# Patient Record
Sex: Female | Born: 1975 | Race: White | Hispanic: No | Smoking: Current every day smoker
Health system: Southern US, Community
[De-identification: ages and names within clinical notes are randomized; demographics above are authoritative.]

## PROBLEM LIST (undated history)

## (undated) DIAGNOSIS — J45909 Unspecified asthma, uncomplicated: Secondary | ICD-10-CM

## (undated) DIAGNOSIS — E079 Disorder of thyroid, unspecified: Secondary | ICD-10-CM

---

## 2018-06-04 ENCOUNTER — Other Ambulatory Visit: Payer: Self-pay

## 2018-06-04 ENCOUNTER — Encounter (HOSPITAL_COMMUNITY): Payer: Self-pay | Admitting: *Deleted

## 2018-06-04 ENCOUNTER — Inpatient Hospital Stay (HOSPITAL_COMMUNITY)
Admission: AD | Admit: 2018-06-04 | Discharge: 2018-06-07 | DRG: 885 | Disposition: A | Discharge status: Home / Self Care | Payer: No Typology Code available for payment source | Source: Other Acute Inpatient Hospital | Attending: Psychiatry | Admitting: Psychiatry

## 2018-06-04 DIAGNOSIS — F319 Bipolar disorder, unspecified: Secondary | ICD-10-CM | POA: Diagnosis present

## 2018-06-04 DIAGNOSIS — J189 Pneumonia, unspecified organism: Secondary | ICD-10-CM | POA: Diagnosis present

## 2018-06-04 DIAGNOSIS — J45909 Unspecified asthma, uncomplicated: Secondary | ICD-10-CM | POA: Diagnosis present

## 2018-06-04 DIAGNOSIS — F259 Schizoaffective disorder, unspecified: Principal | ICD-10-CM | POA: Diagnosis present

## 2018-06-04 DIAGNOSIS — Z915 Personal history of self-harm: Secondary | ICD-10-CM

## 2018-06-04 DIAGNOSIS — R440 Auditory hallucinations: Secondary | ICD-10-CM | POA: Diagnosis not present

## 2018-06-04 DIAGNOSIS — F419 Anxiety disorder, unspecified: Secondary | ICD-10-CM | POA: Diagnosis present

## 2018-06-04 DIAGNOSIS — Z91030 Bee allergy status: Secondary | ICD-10-CM | POA: Diagnosis not present

## 2018-06-04 DIAGNOSIS — F4310 Post-traumatic stress disorder, unspecified: Secondary | ICD-10-CM | POA: Diagnosis present

## 2018-06-04 DIAGNOSIS — R001 Bradycardia, unspecified: Secondary | ICD-10-CM | POA: Diagnosis not present

## 2018-06-04 DIAGNOSIS — R062 Wheezing: Secondary | ICD-10-CM | POA: Diagnosis not present

## 2018-06-04 DIAGNOSIS — G4700 Insomnia, unspecified: Secondary | ICD-10-CM | POA: Diagnosis present

## 2018-06-04 DIAGNOSIS — F17210 Nicotine dependence, cigarettes, uncomplicated: Secondary | ICD-10-CM | POA: Diagnosis present

## 2018-06-04 DIAGNOSIS — E039 Hypothyroidism, unspecified: Secondary | ICD-10-CM | POA: Diagnosis present

## 2018-06-04 DIAGNOSIS — F251 Schizoaffective disorder, depressive type: Secondary | ICD-10-CM | POA: Diagnosis not present

## 2018-06-04 DIAGNOSIS — R45851 Suicidal ideations: Secondary | ICD-10-CM | POA: Diagnosis present

## 2018-06-04 DIAGNOSIS — Z79899 Other long term (current) drug therapy: Secondary | ICD-10-CM

## 2018-06-04 HISTORY — DX: Unspecified asthma, uncomplicated: J45.909

## 2018-06-04 MED ORDER — HYDROXYZINE HCL 25 MG PO TABS
25.0000 mg | ORAL_TABLET | Freq: Three times a day (TID) | ORAL | Status: DC | PRN
  Filled 2018-06-04: qty 10

## 2018-06-04 MED ORDER — TRAZODONE HCL 50 MG PO TABS
50.0000 mg | ORAL_TABLET | Freq: Every evening | ORAL | Status: DC | PRN
  Administered 2018-06-06 – 2018-06-07 (×2): 50 mg via ORAL
  Filled 2018-06-04: qty 1
  Filled 2018-06-04: qty 10

## 2018-06-04 MED ORDER — ALUM & MAG HYDROXIDE-SIMETH 200-200-20 MG/5ML PO SUSP: 30.0000 mL | ORAL | Status: DC | PRN

## 2018-06-04 MED ORDER — ACETAMINOPHEN 325 MG PO TABS: 650.0000 mg | ORAL_TABLET | Freq: Four times a day (QID) | ORAL | Status: DC | PRN

## 2018-06-04 MED ORDER — MAGNESIUM HYDROXIDE 400 MG/5ML PO SUSP: 30.0000 mL | Freq: Every day | ORAL | Status: DC | PRN

## 2018-06-04 MED ORDER — ENSURE ENLIVE PO LIQD
237.0000 mL | Freq: Two times a day (BID) | ORAL | Status: DC
  Administered 2018-06-05 – 2018-06-08 (×5): 237 mL via ORAL

## 2018-06-04 MED ORDER — NICOTINE 21 MG/24HR TD PT24
21.0000 mg | MEDICATED_PATCH | Freq: Every day | TRANSDERMAL | Status: DC
  Administered 2018-06-05 – 2018-06-07 (×3): 21 mg via TRANSDERMAL
  Filled 2018-06-04 (×5): qty 1

## 2018-06-04 NOTE — Tx Team (Signed)
Initial Treatment Plan 06/04/2018 [name] VOH:[id number]    PATIENT STRESSORS: Medication change or noncompliance Occupational concerns Substance abuse   PATIENT STRENGTHS: Average or above average intelligence Capable of independent living Communication skills General fund of knowledge Motivation for treatment/growth Physical Health Supportive family/friends   PATIENT IDENTIFIED PROBLEMS:   "I need to restart my meds."    "Get rid of the voices and visions."               DISCHARGE CRITERIA:  Improved stabilization in mood, thinking, and/or behavior Need for constant or close observation no longer present Reduction of life-threatening or endangering symptoms to within safe limits Verbal commitment to aftercare and medication compliance  PRELIMINARY DISCHARGE PLAN: Outpatient therapy Return to previous living arrangement  PATIENT/FAMILY INVOLVEMENT: This treatment plan has been presented to and reviewed with the patient, Gabrielle Carter, and/or family member.  The patient and family have been given the opportunity to ask questions and make suggestions.  Lawrence Marseilles, RN 06/04/2018, [phone]
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
## 2018-06-05 ENCOUNTER — Inpatient Hospital Stay (HOSPITAL_COMMUNITY): Payer: No Typology Code available for payment source

## 2018-06-05 ENCOUNTER — Inpatient Hospital Stay (HOSPITAL_COMMUNITY)
Admission: AD | Admit: 2018-06-05 | Discharge: 2018-06-05 | Disposition: A | Discharge status: Home / Self Care | Payer: No Typology Code available for payment source | Source: Other Acute Inpatient Hospital | Attending: Psychiatry | Admitting: Psychiatry

## 2018-06-05 LAB — HEMOGLOBIN A1C
Hgb A1c MFr Bld: 5.7 % — ABNORMAL HIGH (ref 4.8–5.6)
Mean Plasma Glucose: 116.89 mg/dL

## 2018-06-05 LAB — LIPID PANEL
Cholesterol: 177 mg/dL (ref 0–200)
HDL: 30 mg/dL — ABNORMAL LOW (ref >40)
LDL Cholesterol: 122 mg/dL — ABNORMAL HIGH (ref 0–99)
Total CHOL/HDL Ratio: 5.9 RATIO
Triglycerides: 123 mg/dL (ref <150)
VLDL: 25 mg/dL (ref 0–40)

## 2018-06-05 LAB — INFLUENZA PANEL BY PCR (TYPE A & B)
Influenza A By PCR: NEGATIVE
Influenza B By PCR: NEGATIVE

## 2018-06-05 LAB — T4, FREE: Free T4: 0.28 ng/dL — ABNORMAL LOW (ref 0.82–1.77)

## 2018-06-05 LAB — TSH: TSH: 97.877 u[IU]/mL — ABNORMAL HIGH (ref 0.350–4.500)

## 2018-06-05 MED ORDER — ALBUTEROL SULFATE HFA 108 (90 BASE) MCG/ACT IN AERS: 2.0000 | INHALATION_SPRAY | Freq: Four times a day (QID) | RESPIRATORY_TRACT | Status: DC | PRN

## 2018-06-05 MED ORDER — RISPERIDONE 0.5 MG PO TABS
0.5000 mg | ORAL_TABLET | Freq: Every day | ORAL | Status: DC
  Administered 2018-06-05: 0.5 mg via ORAL
  Filled 2018-06-05 (×2): qty 1

## 2018-06-05 MED ORDER — ALBUTEROL SULFATE (2.5 MG/3ML) 0.083% IN NEBU: 2.5000 mg | INHALATION_SOLUTION | Freq: Four times a day (QID) | RESPIRATORY_TRACT | Status: DC | PRN

## 2018-06-05 MED ORDER — SERTRALINE HCL 25 MG PO TABS
75.0000 mg | ORAL_TABLET | Freq: Every day | ORAL | Status: DC
  Administered 2018-06-05 – 2018-06-08 (×4): 75 mg via ORAL
  Filled 2018-06-05 (×6): qty 3

## 2018-06-05 MED ORDER — AZITHROMYCIN 250 MG PO TABS
250.0000 mg | ORAL_TABLET | Freq: Every day | ORAL | Status: DC
  Administered 2018-06-06 – 2018-06-08 (×3): 250 mg via ORAL
  Filled 2018-06-05 (×4): qty 1

## 2018-06-05 MED ORDER — AZITHROMYCIN 500 MG PO TABS
500.0000 mg | ORAL_TABLET | Freq: Every day | ORAL | Status: AC
  Administered 2018-06-05: 12:00:00 500 mg via ORAL
  Filled 2018-06-05: qty 1

## 2018-06-05 NOTE — Progress Notes (Signed)
Flu test performed and placed in lab refrigerator for pick up. Droplet precaution sign placed on patient's door. [facility] called for transport to [facility] radiology for chest x-ray.  Staff informed.

## 2018-06-05 NOTE — BHH Counselor (Addendum)
CSW attempted to complete PSA again, however CSW was informed by the patient's attending RN, that she currently does not feel well and does not want to participate in anything other than sleeping at this time.   Patient is being examined for possible flu. Currently on droplet precautions.   CSW will continue to follow and attempt PSA at a later time.    Baldo Daub, MSW, LCSWA Clinical Social Worker [facility]  Phone: [phone]

## 2018-06-05 NOTE — BHH Group Notes (Signed)
[facility] Group Notes:  Nursing Psychoeducation  Date:  06/05/2018  Time:  2:00 PM  Type of Therapy:  Psychoeducational Skills   Group Topic: Identifying Anxiety Triggers, Debunking Cognitive Distortions, and Utilizing Coping Skills  Participation Level:  Did Not Attend   Summary of Progress/Problems: Patient was invited but declined to attend group.   Clois Montavon A Zenith Lamphier 06/05/2018, 2:50 PM 

## 2018-06-05 NOTE — Progress Notes (Addendum)
Patient returned from [facility].  Patient laying in bed.  Food tray to be brought to patient.

## 2018-06-05 NOTE — Plan of Care (Signed)
D: Patient is cooperative. She is compliant with droplet precautions. Endorses passive SI. Denies HI. Endorses auditory hallucinations described as command voices to harm herself and others. Endorses visual hallucinations described as a black spot that comes down. She verbally contracts for safety. Patient has a cough. Patient denies other physical symptoms/pain. Patient states "I just want to sleep".  A: Medications administered per MD order. Support provided. Patient educated on safety on the unit and medications. Routine safety checks every 15 minutes. Patient stated understanding to tell nurse about any new physical symptoms. Patient understands to tell staff of any needs.     R: No adverse drug reactions noted. Patient verbally contracts for safety. Patient remains safe at this time and will continue to monitor.   Problem: Safety: Goal: Periods of time without injury will increase Outcome: Progressing   Patient remains safe and will continue to monitor.

## 2018-06-05 NOTE — BHH Counselor (Signed)
CSW attempted to complete PSA with the patient, however the patient refused to participate in the assessment. The patient reports she is "very depressed and does not feel like talking right now".   CSW will continue to follow and attempt to complete PSA at a later time.     Baldo Daub, MSW, LCSWA Clinical Social Worker [facility]  Phone: [phone]

## 2018-06-05 NOTE — Progress Notes (Signed)
Adult Psychoeducational Group Note  Date:  06/05/2018 Time:  8:37 PM  Group Topic/Focus:  Wrap-Up Group:   The focus of this group is to help patients review their daily goal of treatment and discuss progress on daily workbooks.  Participation Level:  Did Not Attend  Participation Quality:   Affect:   Cognitive:    Insight:   Engagement in Group:   Modes of Intervention:    Additional Comments:  Pt did not attend due to illness.  Kristine Linea 06/05/2018, 8:37 PM

## 2018-06-05 NOTE — B[facility] Suicide Risk Assessment (Signed)
[facility] Admission Suicide Risk Assessment   Nursing information obtained from:  Patient, Review of record Demographic factors:  Caucasian, Unemployed Current Mental Status:  Suicidal ideation indicated by patient, Suicide plan, Plan includes specific time, place, or method, Self-harm thoughts, Self-harm behaviors, Intention to act on suicide plan, Belief that plan would result in death, Thoughts of violence towards others Loss Factors:  Decrease in vocational status Historical Factors:  Family history of mental illness or substance abuse, Impulsivity Risk Reduction Factors:  Sense of responsibility to family, Living with another person, especially a relative, Positive social support  Total Time spent with patient: 30 minutes Principal Problem: <principal problem not specified> Diagnosis:  Active Problems:   Schizoaffective disorder (HCC)  Subjective Data: Patient is seen and examined.  Patient is a 43 year old female who stated she has a history of schizophrenia, bipolar disorder, depression, anxiety and suicidal ideation.  She presented to the [facility] emergency department on 06/04/2018 with suicidal ideation.  The patient stated that she had called 911 for help on the date of admission because she was having suicidal thoughts.  She had several suicide attempts in the past.  She stated over the last several days she had put a knife to her throat and caused a small laceration to her inferior chin.  She stated that time she was "on the verge of killing myself".  She admitted to helplessness, hopelessness and worthlessness.  She admitted to auditory and visual hallucinations.  She stated that she saw shadows go past her, and also admitted to hearing voices that were located both inside and outside of her head.  She is followed at [facility] in [place], but has not had her medications for the last 3 or 4 days.  She denied any previous psychiatric admissions.  Her drug screen was positive for marijuana as  well as cocaine.  She has a cough, and does not feel well from a pulmonary standpoint.  Review of the chart from [place] does not reveal a chest x-ray.  Her white count was normal.  She was admitted to the hospital for evaluation and stabilization.  Continued Clinical Symptoms:  Alcohol Use Disorder Identification Test Final Score (AUDIT): 1 The "Alcohol Use Disorders Identification Test", Guidelines for Use in Primary Care, Second Edition.  World Science writer [facility]). Score between 0-7:  no or low risk or alcohol related problems. Score between 8-15:  moderate risk of alcohol related problems. Score between 16-19:  high risk of alcohol related problems. Score 20 or above:  warrants further diagnostic evaluation for alcohol dependence and treatment.   CLINICAL FACTORS:   Bipolar Disorder:   Mixed State Depression:   Anhedonia Comorbid alcohol abuse/dependence Hopelessness Impulsivity Insomnia Alcohol/Substance Abuse/Dependencies   Musculoskeletal: Strength & Muscle Tone: within normal limits Gait & Station: normal Patient leans: N/A  Psychiatric Specialty Exam: Physical Exam  Nursing note and vitals reviewed. Constitutional: She is oriented to person, place, and time. She appears well-developed and well-nourished.  HENT:  Head: Normocephalic and atraumatic.  Respiratory: Effort normal.  Neurological: She is alert and oriented to person, place, and time.    ROS  Blood pressure 94/62, pulse 79, temperature 98.8 F (37.1 C), temperature source Oral, resp. rate 18, height 5\' 11"  (1.803 m), weight 94.8 kg.Body mass index is 29.15 kg/m.  General Appearance: Disheveled  Eye Contact:  Fair  Speech:  Normal Rate  Volume:  Normal  Mood:  Anxious, Depressed and Dysphoric  Affect:  Congruent  Thought Process:  Coherent and Descriptions  of Associations: Circumstantial  Orientation:  Full (Time, Place, and Person)  Thought Content:  Hallucinations: Auditory Visual  Suicidal  Thoughts:  Yes.  without intent/plan  Homicidal Thoughts:  No  Memory:  Immediate;   Fair Recent;   Fair Remote;   Fair  Judgement:  Impaired  Insight:  Lacking  Psychomotor Activity:  Increased  Concentration:  Concentration: Fair and Attention Span: Fair  Recall:  [employer]Fair  Fund of Knowledge:  Fair  Language:  Fair  Akathisia:  Negative  Handed:  Right  AIMS (if indicated):     Assets:  Desire for Improvement Housing Resilience  ADL's:  Intact  Cognition:  WNL  Sleep:  Number of Hours: 6.5      COGNITIVE FEATURES THAT CONTRIBUTE TO RISK:  None    SUICIDE RISK:   Minimal: No identifiable suicidal ideation.  Patients presenting with no risk factors but with morbid ruminations; may be classified as minimal risk based on the severity of the depressive symptoms  PLAN OF CARE: Patient is seen and examined.  Patient is a 43 year old female with the above-stated past psychiatric history who [facility] emergency department with suicidal ideation.  She was transferred to our facility for evaluation and stabilization.  She is followed at the local mental health center, but did not had her medications in the last several days.  She reportedly has been taking Risperdal as well as sertraline.  She will be restarted on these.  Her drug screen was positive for cocaine and marijuana, and that may have exacerbated things.  She stated that she does not know for sure that she can go back to stay with her husband.  She stated that her family does not like her, and will not have anything to do with her.  She refused to go into detail about that.  She will be admitted to the hospital.  She will be encouraged to attend groups.  She will be encouraged to be compliant with her medications.  Given her cough at this point, and the fact that review of her electronic medical record revealed a history of bilateral pneumonia.  I will order chest x-ray today.  We will start azithromycin for  community-acquired pneumonia.  We will also get an influenza a and B.  Her white count is normal.  She is afebrile at this point.  We will try to get collateral information for additional help.  I certify that inpatient services furnished can reasonably be expected to improve the patient's condition.   Antonieta PertGreg Lawson Dovey Fatzinger, MD 06/05/2018, 10:05 AM

## 2018-06-05 NOTE — Progress Notes (Signed)
D:  Patient stated she does have SI thoughts, no plan while at [facility].  Denied HI.  Does see shadows and hears voices to hurt herself.  Stated she has trouble sleeping and has decreased appetite before [facility] admission.  Patient has stayed in bed most of the day resting, "just let me sleep".  Patient did go to [facility] for chest xray this morning.   A:  Medications administered per MD orders.  Emotional support and encouragement given patient. R:  Safety maintained with 15 minute checks.  Food trays have been brought to her today.

## 2018-06-05 NOTE — Plan of Care (Signed)
Nurse discussed anxiety, depression, coping skills with patient. 

## 2018-06-05 NOTE — Progress Notes (Signed)
Pt did not attend orientation/goals group this morning.   

## 2018-06-05 NOTE — Progress Notes (Signed)
NUTRITION ASSESSMENT  Pt identified as at risk on the Malnutrition Screen Tool  INTERVENTION: 1. Supplements: Continue Ensure Enlive po BID, each supplement provides 350 kcal and 20 grams of protein  NUTRITION DIAGNOSIS: Unintentional weight loss related to sub-optimal intake as evidenced by pt report.   Goal: Pt to meet >/= 90% of their estimated nutrition needs.  Monitor:  PO intake  Assessment:  Pt admitted with depression and SI. UDS + for cocaine, THC. Pt reports poor appetite and losing 15 lb+ of weight loss recently. Per weight records available through care everywhere, pt weighed 232 lb at [facility] on 03/09/18, pt has since lost 24 lb (10% wt loss x 3 months, significant for time frame).  Height: Ht Readings from Last 1 Encounters:  06/04/18 5\' 11"  (1.803 m)    Weight: Wt Readings from Last 1 Encounters:  06/04/18 94.8 kg    Weight Hx: Wt Readings from Last 10 Encounters:  06/04/18 94.8 kg    BMI:  Body mass index is 29.15 kg/m. Pt meets criteria for overweight based on current BMI.  Estimated Nutritional Needs: Kcal: 25-30 kcal/kg Protein: > 1 gram protein/kg Fluid: 1 ml/kcal  Diet Order:  Diet Order            Diet regular Room service appropriate? Yes; Fluid consistency: Thin  Diet effective now             Pt is also offered choice of unit snacks mid-morning and mid-afternoon.  Pt is eating as desired.   Lab results and medications reviewed.   Tilda Franco, MS, RD, LDN [facility] Inpatient Clinical Dietitian Pager: [phone] After Hours Pager: [phone]

## 2018-06-05 NOTE — H&P (Signed)
Psychiatric Admission Assessment Adult  Patient Identification: Rakesha Caroleena Paolini MRN:  [id number] Date of Evaluation:  06/05/2018 Chief Complaint:  schizoaffective Principal Diagnosis: Schizoaffective disorder (HCC) Diagnosis:  Principal Problem:   Schizoaffective disorder (HCC)  History of Present Illness: From MD's admission SRA:  Patient is a 43 year old female who stated she has a history of schizophrenia, bipolar disorder, depression, anxiety and suicidal ideation.  She presented to the [facility] emergency department on 06/04/2018 with suicidal ideation.  The patient stated that she had called 911 for help on the date of admission because she was having suicidal thoughts.  She had several suicide attempts in the past.  She stated over the last several days she had put a knife to her throat and caused a small laceration to her inferior chin.  She stated that time she was "on the verge of killing myself".  She admitted to helplessness, hopelessness and worthlessness.  She admitted to auditory and visual hallucinations.  She stated that she saw shadows go past her, and also admitted to hearing voices that were located both inside and outside of her head.  She is followed at [facility] in [place], but has not had her medications for the last 3 or 4 days.  She denied any previous psychiatric admissions.  Her drug screen was positive for marijuana as well as cocaine.  She has a cough, and does not feel well from a pulmonary standpoint.  Review of the chart from  does not reveal a chest x-ray.  Her white count was normal.  She was admitted to the hospital for evaluation and stabilization.  Associated Signs/Symptoms: Depression Symptoms:  depressed mood, insomnia, fatigue, feelings of worthlessness/guilt, hopelessness, suicidal thoughts with specific plan, weight loss, decreased appetite, (Hypo) Manic Symptoms:  Irritable Mood, Anxiety Symptoms:  Excessive Worry, Psychotic Symptoms:   Hallucinations: Auditory Command:  to hurt herself Visual of shadows PTSD Symptoms: Patient reports prior diagnosis with PTSD but declines to elaborate. Total Time spent with patient: 30 minutes  Past Psychiatric History: Seen at [facility] for last five years. Previously diagnosed with depression, schizophrenia, PTSD, bipolar disorder. History of auditory hallucinations "for years," typically during episodes of depression. Denies history of hospitalizations but reports two prior suicide attempts via cutting throat, most recently several days prior to admission. Denies history of manic symptoms.   Is the patient at risk to self? Yes.    Has the patient been a risk to self in the past 6 months? Yes.    Has the patient been a risk to self within the distant past? Yes.    Is the patient a risk to others? No.  Has the patient been a risk to others in the past 6 months? No.  Has the patient been a risk to others within the distant past? No.   Prior Inpatient Therapy: Prior Inpatient Therapy: No Prior Outpatient Therapy: Prior Outpatient Therapy: Yes Prior Therapy Dates: Current Prior Therapy Facilty/Provider(s): [facility] in [place] Reason for Treatment: med management & therapy Does patient have an ACCT team?: No Does patient have Intensive In-House Services?  : No Does patient have [facility] services? : No Does patient have P4CC services?: No  Alcohol Screening: 1. How often do you have a drink containing alcohol?: Monthly or less 2. How many drinks containing alcohol do you have on a typical day when you are drinking?: 1 or 2 3. How often do you have six or more drinks on one occasion?: Never AUDIT-C Score: 1 4. How often during the  last year have you found that you were not able to stop drinking once you had started?: Never 5. How often during the last year have you failed to do what was normally expected from you becasue of drinking?: Never 6. How often during the last year have you needed  a first drink in the morning to get yourself going after a heavy drinking session?: Never 7. How often during the last year have you had a feeling of guilt of remorse after drinking?: Never 8. How often during the last year have you been unable to remember what happened the night before because you had been drinking?: Never 9. Have you or someone else been injured as a result of your drinking?: No 10. Has a relative or friend or a doctor or another health worker been concerned about your drinking or suggested you cut down?: No Alcohol Use Disorder Identification Test Final Score (AUDIT): 1 Alcohol Brief Interventions/Follow-up: AUDIT Score <7 follow-up not indicated Substance Abuse History in the last 12 months:  Yes.  Daily THC. UDS also +cocaine. Consequences of Substance Abuse: Patient denies Previous Psychotropic Medications: Yes  Psychological Evaluations: No  Past Medical History:  Past Medical History:  Diagnosis Date  . Asthma    History reviewed. No pertinent surgical history. Family History: History reviewed. No pertinent family history. Family Psychiatric  History: Denies Tobacco Screening: Have you used any form of tobacco in the last 30 days? (Cigarettes, Smokeless Tobacco, Cigars, and/or Pipes): Yes Tobacco use, Select all that apply: 5 or more cigarettes per day Are you interested in Tobacco Cessation Medications?: Yes, will notify MD for an order Counseled patient on smoking cessation including recognizing danger situations, developing coping skills and basic information about quitting provided: Refused/Declined practical counseling Social History:  Social History   Substance and Sexual Activity  Alcohol Use Yes  . Alcohol/week: 2.0 standard drinks  . Types: 2 Standard drinks or equivalent per week   Comment: monthly or less     Social History   Substance and Sexual Activity  Drug Use Yes  . Types: Cocaine, Marijuana    Additional Social History: Marital status:  Married    Pain Medications: None Prescriptions: Fluoxetine HCI  in AM  last taken 04/03; Olanzapine  daily last taken 04/03 Over the Counter: Unknown History of alcohol / drug use?: Yes Name of Substance 1: Marijuana 1 - Age of First Use: 43 years of age 25 - Amount (size/oz): One joint  1 - Frequency: Every other day  1 - Duration: on-going 1 - Last Use / Amount: Unknown                  Allergies:   Allergies  Allergen Reactions  . Bee Venom Anaphylaxis   Lab Results:  Results for orders placed or performed during the hospital encounter of 06/04/18 (from the past 48 hour(s))  Lipid panel     Status: Abnormal   Collection Time: 06/05/18  6:23 AM  Result Value Ref Range   Cholesterol 177 0 - 200 mg/dL   Triglycerides 161 <096 mg/dL   HDL 30 (L) >04 mg/dL   Total CHOL/HDL Ratio 5.9 RATIO   VLDL 25 0 - 40 mg/dL   LDL Cholesterol 540 (H) 0 - 99 mg/dL    Comment:        Total Cholesterol/HDL:CHD Risk Coronary Heart Disease Risk Table  Men   Women  1/2 Average Risk   3.4   3.3  Average Risk       5.0   4.4  2 X Average Risk   9.6   7.1  3 X Average Risk  23.4   11.0        Use the calculated Patient Ratio above and the CHD Risk Table to determine the patient's CHD Risk.        ATP III CLASSIFICATION (LDL):  <100     mg/dL   Optimal  [id number]  mg/dL   Near or Above                    Optimal  130-159  mg/dL   Borderline  [id number]  mg/dL   High  >811>190     mg/dL   Very High Performed at [facility], [address] [place], [place] [address]   Hemoglobin A1c     Status: Abnormal   Collection Time: 06/05/18  6:23 AM  Result Value Ref Range   Hgb A1c MFr Bld 5.7 (H) 4.8 - 5.6 %    Comment: (NOTE) Pre diabetes:          5.7%-6.4% Diabetes:              >6.4% Glycemic control for   <7.0% adults with diabetes    Mean Plasma Glucose 116.89 mg/dL    Comment: Performed at [facility], [address]  [place], [place] [address]  TSH     Status: Abnormal   Collection Time: 06/05/18  6:23 AM  Result Value Ref Range   TSH 97.877 (H) 0.350 - 4.500 uIU/mL    Comment: Performed by a 3rd Generation assay with a functional sensitivity of <=0.01 uIU/mL. Performed at [facility], [address] [place], [place] [address]     Blood Alcohol level:  No results found for: [facility]  Metabolic Disorder Labs:  Lab Results  Component Value Date   HGBA1C 5.7 (H) 06/05/2018   MPG 116.89 06/05/2018   No results found for: PROLACTIN Lab Results  Component Value Date   CHOL 177 06/05/2018   TRIG 123 06/05/2018   HDL 30 (L) 06/05/2018   CHOLHDL 5.9 06/05/2018   VLDL 25 06/05/2018   LDLCALC 122 (H) 06/05/2018    Current Medications: Current Facility-Administered Medications  Medication Dose Route Frequency Provider Last Rate Last Dose  . acetaminophen (TYLENOL) tablet 650 mg  650 mg Oral Q6H PRN Nira ConnBerry, Jason A, NP      . albuterol (PROVENTIL) (2.5 MG/3ML) 0.083% nebulizer solution 2.5 mg  2.5 mg Nebulization Q6H PRN Antonieta Pertlary, Greg Lawson, MD      . alum & mag hydroxide-simeth (MAALOX/MYLANTA) 200-200-20 MG/5ML suspension 30 mL  30 mL Oral Q4H PRN Nira ConnBerry, Jason A, NP      . azithromycin (ZITHROMAX) tablet 500 mg  500 mg Oral Daily Antonieta Pertlary, Greg Lawson, MD       Followed by  . [START ON 06/06/2018] azithromycin (ZITHROMAX) tablet 250 mg  250 mg Oral Daily Antonieta Pertlary, Greg Lawson, MD      . feeding supplement (ENSURE ENLIVE) (ENSURE ENLIVE) liquid 237 mL  237 mL Oral BID BM Cobos, Rockey SituFernando A, MD   237 mL at 06/05/18 1035  . hydrOXYzine (ATARAX/VISTARIL) tablet 25 mg  25 mg Oral TID PRN Nira ConnBerry, Jason A, NP      . magnesium hydroxide (MILK OF MAGNESIA) suspension 30 mL  30  mL Oral Daily PRN Nira Conn A, NP      . nicotine (NICODERM CQ - dosed in mg/24 hours) patch 21 mg  21 mg Transdermal Daily Cobos, Rockey Situ, MD   21 mg at 06/05/18 0900  . risperiDONE (RISPERDAL) tablet 0.5 mg  0.5 mg Oral QHS  Antonieta Pert, MD      . sertraline (ZOLOFT) tablet 75 mg  75 mg Oral Daily Antonieta Pert, MD      . traZODone (DESYREL) tablet 50 mg  50 mg Oral QHS PRN Jackelyn Poling, NP       PTA Medications: Medications Prior to Admission  Medication Sig Dispense Refill Last Dose  . FLUOXETINE HCL PO Take by mouth daily.      Marland Kitchen RISPERIDONE PO Take by mouth.       Musculoskeletal: Strength & Muscle Tone: within normal limits Gait & Station: normal Patient leans: N/A  Psychiatric Specialty Exam: Physical Exam  Nursing note and vitals reviewed. Constitutional: She is oriented to person, place, and time. She appears well-developed.  Cardiovascular: Normal rate.  Respiratory: Effort normal.  Neurological: She is alert and oriented to person, place, and time.    Review of Systems  Constitutional: Negative.   Psychiatric/Behavioral: Positive for depression, hallucinations, substance abuse (cocaine, THC) and suicidal ideas. The patient is not nervous/anxious and does not have insomnia.     Blood pressure 94/62, pulse 79, temperature 98.8 F (37.1 C), temperature source Oral, resp. rate 18, height 5\' 11"  (1.803 m), weight 94.8 kg.Body mass index is 29.15 kg/m.  See MD's admission SRA    Treatment Plan Summary: Daily contact with patient to assess and evaluate symptoms and progress in treatment and Medication management   Inpatient hospitalization.  See MD's admission SRA for medication management.  Patient will participate in the therapeutic group milieu.  Discharge disposition in progress.   Observation Level/Precautions:  15 minute checks, droplet precautions for cough  Laboratory:  influenza  Psychotherapy:  Mental health and substance use  Medications:  See MAR  Consultations:  PRN  Discharge Concerns:  Safety and stabilization  Estimated LOS: 3-5 days  Other:     Physician Treatment Plan for Primary Diagnosis: Schizoaffective disorder (HCC) Long Term Goal(s):  Improvement in symptoms so as ready for discharge  Short Term Goals: Ability to identify changes in lifestyle to reduce recurrence of condition will improve, Ability to verbalize feelings will improve and Ability to disclose and discuss suicidal ideas  Physician Treatment Plan for Secondary Diagnosis: Principal Problem:   Schizoaffective disorder (HCC)  Long Term Goal(s): Improvement in symptoms so as ready for discharge  Short Term Goals: Ability to demonstrate self-control will improve and Ability to identify and develop effective coping behaviors will improve  I certify that inpatient services furnished can reasonably be expected to improve the patient's condition.    Aldean Baker, NP 4/7/202011:05 AM

## 2018-06-05 NOTE — Progress Notes (Signed)
Gabrielle Carter from [employer], phone [phone], called and stated:   Patient is to wear a mask, wash hands often.  Food tray to be brought to her room.  Wipe surfaces often.  Droplet precaution sign has been placed on her door.    If any questions, MD/staff can call her and she will relay message to Dr. Ninetta Lights (pager [phone]).

## 2018-06-06 ENCOUNTER — Inpatient Hospital Stay (HOSPITAL_COMMUNITY): Payer: No Typology Code available for payment source

## 2018-06-06 DIAGNOSIS — R45851 Suicidal ideations: Secondary | ICD-10-CM

## 2018-06-06 DIAGNOSIS — F251 Schizoaffective disorder, depressive type: Secondary | ICD-10-CM

## 2018-06-06 DIAGNOSIS — R440 Auditory hallucinations: Secondary | ICD-10-CM

## 2018-06-06 DIAGNOSIS — E039 Hypothyroidism, unspecified: Secondary | ICD-10-CM

## 2018-06-06 DIAGNOSIS — R062 Wheezing: Secondary | ICD-10-CM

## 2018-06-06 LAB — T3, FREE: T3, Free: 1.4 pg/mL — ABNORMAL LOW (ref 2.0–4.4)

## 2018-06-06 MED ORDER — RISPERIDONE 1 MG PO TABS
1.0000 mg | ORAL_TABLET | Freq: Every day | ORAL | Status: DC
  Administered 2018-06-06 – 2018-06-07 (×2): 1 mg via ORAL
  Filled 2018-06-06 (×3): qty 1

## 2018-06-06 MED ORDER — LEVOTHYROXINE SODIUM 50 MCG PO TABS
50.0000 ug | ORAL_TABLET | Freq: Every day | ORAL | Status: DC
  Administered 2018-06-06 – 2018-06-08 (×3): 50 ug via ORAL
  Filled 2018-06-06 (×3): qty 1
  Filled 2018-06-06: qty 2
  Filled 2018-06-06: qty 1

## 2018-06-06 NOTE — Progress Notes (Signed)
[facility] MD Progress Note  06/06/2018 9:50 AM Gabrielle Carter  MRN:  [id number] Subjective:  "I'm not good. I'm still depressed."  Gabrielle Carter resting in bed. Presents with depressed affect, minimal speech. She reports continued severe depression, unchanged since admission and is unable to identify triggers. T3 1.4 and T4 0.28. Patient denies known history of thyroid problems. She is going for thyroid ultrasound today. She continues to report intermittent AH, including CAH to hurt herself. She reports ongoing suicidal ideation with no plan or intent but contracts for safety on the unit. Denies HI. She reports cough has improved since starting Zithromax. Denies myalgias, SOB, headache. She is afebrile. Influenza A and B negative. Reports fair sleep and appetite.  From admission H&P: Patient is a 43 year old female who stated she has a history of schizophrenia, bipolar disorder, depression, anxiety and suicidal ideation. She presented to the [facility] emergency department on 06/04/2018 with suicidal ideation. She admitted to auditory and visual hallucinations.She stated that she saw shadows go past her, and also admitted to hearing voices that were located both inside and outside of her head.   Principal Problem: Schizoaffective disorder (HCC) Diagnosis: Principal Problem:   Schizoaffective disorder (HCC) Active Problems:   Wheezing  Total Time spent with patient: 15 minutes  Past Psychiatric History: See admission H&P  Past Medical History:  Past Medical History:  Diagnosis Date  . Asthma    History reviewed. No pertinent surgical history. Family History: History reviewed. No pertinent family history. Family Psychiatric  History: See admission H&P Social History:  Social History   Substance and Sexual Activity  Alcohol Use Yes  . Alcohol/week: 2.0 standard drinks  . Types: 2 Standard drinks or equivalent per week   Comment: monthly or less     Social History   Substance and  Sexual Activity  Drug Use Yes  . Types: Cocaine, Marijuana    Social History   Socioeconomic History  . Marital status: Married    Spouse name: Gabrielle Carter  . Number of children: Not on file  . Years of education: Not on file  . Highest education level: Not on file  Occupational History  . Not on file  Social Needs  . Financial resource strain: Not on file  . Food insecurity:    Worry: Not on file    Inability: Not on file  . Transportation needs:    Medical: Not on file    Non-medical: Not on file  Tobacco Use  . Smoking status: Current Every Day Smoker    Packs/day: 1.50    Types: Cigarettes  . Smokeless tobacco: Never Used  Substance and Sexual Activity  . Alcohol use: Yes    Alcohol/week: 2.0 standard drinks    Types: 2 Standard drinks or equivalent per week    Comment: monthly or less  . Drug use: Yes    Types: Cocaine, Marijuana  . Sexual activity: Yes  Lifestyle  . Physical activity:    Days per week: Not on file    Minutes per session: Not on file  . Stress: Not on file  Relationships  . Social connections:    Talks on phone: Not on file    Gets together: Not on file    Attends religious service: Not on file    Active member of club or organization: Not on file    Attends meetings of clubs or organizations: Not on file    Relationship status: Not on file  Other Topics Concern  .  Not on file  Social History Narrative  . Not on file   Additional Social History:    Pain Medications: None Prescriptions: Fluoxetine HCI 40mg  in AM  last taken 04/03; Olanzapine 10mg  daily last taken 04/03 Over the Counter: Unknown History of alcohol / drug use?: Yes Name of Substance 1: Marijuana 1 - Age of First Use: 43 years of age 55 - Amount (size/oz): One joint  1 - Frequency: Every other day  1 - Duration: on-going 1 - Last Use / Amount: Unknown                  Sleep: Fair  Appetite:  Fair  Current Medications: Current Facility-Administered  Medications  Medication Dose Route Frequency Provider Last Rate Last Dose  . acetaminophen (TYLENOL) tablet 650 mg  650 mg Oral Q6H PRN Nira ConnBerry, Jason A, NP      . albuterol (PROVENTIL HFA;VENTOLIN HFA) 108 (90 Base) MCG/ACT inhaler 2 puff  2 puff Inhalation Q6H PRN Cobos, Rockey SituFernando A, MD      . alum & mag hydroxide-simeth (MAALOX/MYLANTA) 200-200-20 MG/5ML suspension 30 mL  30 mL Oral Q4H PRN Nira ConnBerry, Jason A, NP      . azithromycin (ZITHROMAX) tablet 250 mg  250 mg Oral Daily Antonieta Pertlary, Greg Lawson, MD   250 mg at 06/06/18 0744  . feeding supplement (ENSURE ENLIVE) (ENSURE ENLIVE) liquid 237 mL  237 mL Oral BID BM Cobos, Rockey SituFernando A, MD   237 mL at 06/05/18 1545  . hydrOXYzine (ATARAX/VISTARIL) tablet 25 mg  25 mg Oral TID PRN Nira ConnBerry, Jason A, NP      . magnesium hydroxide (MILK OF MAGNESIA) suspension 30 mL  30 mL Oral Daily PRN Nira ConnBerry, Jason A, NP      . nicotine (NICODERM CQ - dosed in mg/24 hours) patch 21 mg  21 mg Transdermal Daily Cobos, Rockey SituFernando A, MD   21 mg at 06/06/18 0744  . risperiDONE (RISPERDAL) tablet 0.5 mg  0.5 mg Oral QHS Antonieta Pertlary, Greg Lawson, MD   0.5 mg at 06/05/18 2116  . sertraline (ZOLOFT) tablet 75 mg  75 mg Oral Daily Antonieta Pertlary, Greg Lawson, MD   75 mg at 06/06/18 0744  . traZODone (DESYREL) tablet 50 mg  50 mg Oral QHS PRN Jackelyn PolingBerry, Jason A, NP        Lab Results:  Results for orders placed or performed during the hospital encounter of 06/04/18 (from the past 48 hour(s))  Lipid panel     Status: Abnormal   Collection Time: 06/05/18  6:23 AM  Result Value Ref Range   Cholesterol 177 0 - 200 mg/dL   Triglycerides 161123 <096<150 mg/dL   HDL 30 (L) >04>40 mg/dL   Total CHOL/HDL Ratio 5.9 RATIO   VLDL 25 0 - 40 mg/dL   LDL Cholesterol 540122 (H) 0 - 99 mg/dL    Comment:        Total Cholesterol/HDL:CHD Risk Coronary Heart Disease Risk Table                     Men   Women  1/2 Average Risk   3.4   3.3  Average Risk       5.0   4.4  2 X Average Risk   9.6   7.1  3 X Average Risk  23.4   11.0         Use the calculated Patient Ratio above and the CHD Risk Table to determine the patient's CHD Risk.  ATP III CLASSIFICATION (LDL):  <100     mg/dL   Optimal  161-096  mg/dL   Near or Above                    Optimal  130-159  mg/dL   Borderline  045-409  mg/dL   High  >811     mg/dL   Very High Performed at [facility], [address], [place], [place] [address]   Hemoglobin A1c     Status: Abnormal   Collection Time: 06/05/18  6:23 AM  Result Value Ref Range   Hgb A1c MFr Bld 5.7 (H) 4.8 - 5.6 %    Comment: (NOTE) Pre diabetes:          5.7%-6.4% Diabetes:              >6.4% Glycemic control for   <7.0% adults with diabetes    Mean Plasma Glucose 116.89 mg/dL    Comment: Performed at [facility], [address], [place], [place] [address]  TSH     Status: Abnormal   Collection Time: 06/05/18  6:23 AM  Result Value Ref Range   TSH 97.877 (H) 0.350 - 4.500 uIU/mL    Comment: Performed by a 3rd Generation assay with a functional sensitivity of <=0.01 uIU/mL. Performed at [facility], [address], [place], [place] [address]   Influenza panel by PCR (type A & B)     Status: None   Collection Time: 06/05/18 10:37 AM  Result Value Ref Range   Influenza A By PCR NEGATIVE NEGATIVE   Influenza B By PCR NEGATIVE NEGATIVE    Comment: (NOTE) The Xpert Xpress Flu assay is intended as an aid in the diagnosis of  influenza and should not be used as a sole basis for treatment.  This  assay is FDA approved for nasopharyngeal swab specimens only. Nasal  washings and aspirates are unacceptable for Xpert Xpress Flu testing. Performed at [facility], [address] [place], [place] [address]   T3, free     Status: Abnormal   Collection Time: 06/05/18  6:50 PM  Result Value Ref Range   T3, Free 1.4 (L) 2.0 - 4.4 pg/mL    Comment: (NOTE) Performed At: [facility] [address] [place], [place]  [id number] Jolene Schimke MD LK:[id number]   T4, free     Status: Abnormal   Collection Time: 06/05/18  6:50 PM  Result Value Ref Range   Free T4 0.28 (L) 0.82 - 1.77 ng/dL    Comment: (NOTE) Biotin ingestion may interfere with free T4 tests. If the results are inconsistent with the TSH level, previous test results, or the clinical presentation, then consider biotin interference. If needed, order repeat testing after stopping biotin. Performed at [facility], [address], [place], [place] [address]     Blood Alcohol level:  No results found for: [facility]  Metabolic Disorder Labs: Lab Results  Component Value Date   HGBA1C 5.7 (H) 06/05/2018   MPG 116.89 06/05/2018   No results found for: PROLACTIN Lab Results  Component Value Date   CHOL 177 06/05/2018   TRIG 123 06/05/2018   HDL 30 (L) 06/05/2018   CHOLHDL 5.9 06/05/2018   VLDL 25 06/05/2018   LDLCALC 122 (H) 06/05/2018    Physical Findings: AIMS: Facial and Oral Movements Muscles of Facial Expression: None, normal Lips and Perioral Area: None, normal Jaw: None, normal  Tongue: None, normal,Extremity Movements Upper (arms, wrists, hands, fingers): None, normal Lower (legs, knees, ankles, toes): None, normal, Trunk Movements Neck, shoulders, hips: None, normal, Overall Severity Severity of abnormal movements (highest score from questions above): None, normal Incapacitation due to abnormal movements: None, normal Patient's awareness of abnormal movements (rate only patient's report): No Awareness, Dental Status Current problems with teeth and/or dentures?: No Does patient usually wear dentures?: No  CIWA:  CIWA-Ar Total: 1 COWS:  COWS Total Score: 1  Musculoskeletal: Strength & Muscle Tone: within normal limits Gait & Station: normal Patient leans: N/A  Psychiatric Specialty Exam: Physical Exam  Nursing note and vitals reviewed. Constitutional: She is oriented to person, place, and time. She appears  well-developed and well-nourished.  Cardiovascular: Normal rate.  Respiratory: Effort normal.  Neurological: She is alert and oriented to person, place, and time.    Review of Systems  Constitutional: Negative.   Respiratory: Positive for cough. Negative for shortness of breath.   Cardiovascular: Negative for chest pain.  Gastrointestinal: Negative for nausea and vomiting.  Musculoskeletal: Negative for myalgias.  Psychiatric/Behavioral: Positive for depression, hallucinations, substance abuse (cocaine, THC) and suicidal ideas. Negative for memory loss. The patient is not nervous/anxious and does not have insomnia.     Blood pressure 104/60, pulse (!) 50, temperature 98.4 F (36.9 C), temperature source Oral, resp. rate 18, height 5\' 11"  (1.803 m), weight 94.8 kg, last menstrual period 06/03/2018.Body mass index is 29.15 kg/m.  General Appearance: Disheveled  Eye Contact:  Minimal  Speech:  Slow  Volume:  Decreased  Mood:  Depressed and Irritable  Affect:  Congruent  Thought Process:  Coherent  Orientation:  Full (Time, Place, and Person)  Thought Content:  Hallucinations: Auditory Command:  to hurt herself Visual  Suicidal Thoughts:  Yes.  without intent/plan  Homicidal Thoughts:  No  Memory:  Immediate;   Fair Recent;   Fair  Judgement:  Intact  Insight:  Fair  Psychomotor Activity:  Decreased  Concentration:  Concentration: Fair  Recall:  Poor  Fund of Knowledge:  Fair  Language:  Fair  Akathisia:  No  Handed:  Right  AIMS (if indicated):     Assets:  Communication Skills Housing Social Support  ADL's:  Intact  Cognition:  WNL  Sleep:  Number of Hours: 5.75     Treatment Plan Summary: Daily contact with patient to assess and evaluate symptoms and progress in treatment and Medication management   Continue inpatient hospitalization.  Thyroid ultrasound pending Start Synthroid 50 mcg PO daily for hypothyroidism Increase Risperdal to 1 mg PO QHS for  AVH Continue Zoloft 75 mg PO daily for mood Continue trazodone 50 mg PO QHS PRN insomnia Continue Vistaril 25 mg PO TID PRN anxiety Continue Zithromax 250 mg PO daily for pneumonia Continue albuterol inhaler PRN SOB  Patient will participate in the therapeutic group milieu.  Discharge disposition in progress.   Aldean Baker, NP 06/06/2018, 9:50 AM

## 2018-06-06 NOTE — Progress Notes (Signed)
D    Pt in her room in bed but not asleep    She reports feeling better and took her medications    She has been isolated to her room and is on droplet precautions for having a cough but has been afebrile since admission with no other symptoms    She has been cooperative A   Verbal support given   Medications administered and effectiveness monitored    Q 15 min checks  R    Pt remains safe at this tiem

## 2018-06-06 NOTE — B[facility] Counselor (Signed)
CSW attempted to engage with the patient and complete PSA, however the patient refused to participate. She reports that she does not feel well and that she did not feel like talking to this clinician.   CSW will continue to follow.    Baldo Daub, MSW, LCSWA Clinical Social Worker [facility]  Phone: [phone]

## 2018-06-06 NOTE — B[facility] Group Notes (Signed)
Adult Psychoeducational Group Note  Date:  06/06/2018 Time:  4:10 PM  Group Topic/Focus:  Crisis Planning:   The purpose of this group is to help patients create a crisis plan for use upon discharge or in the future, as needed.  Participation Level:  Did Not Attend  Participation Quality:    Affect:    Cognitive:    Insight:   Engagement in Group:    Modes of Intervention:    Additional Comments: Pt could not attend due to droplet precautions  Donell Beers 06/06/2018, 4:10 PM

## 2018-06-06 NOTE — Progress Notes (Signed)
Recreation Therapy Notes  Date:  4.8.20 Time: 0930 Location: [address] Dayroom  Group Topic: Stress Management  Goal Area(s) Addresses:  Patient will identify positive stress management techniques. Patient will identify benefits of using stress management post d/c.  Intervention: Stress Management  Activity :  Meditation.  LRT introduced the stress management technique of meditation.  LRT played a meditation that focused on making the most of your day and the possibilities it offers.  Patients were to follow along as meditation played to engage in activity.  Education:  Stress Management, Discharge Planning.   Education Outcome: Acknowledges Education  Clinical Observations/Feedback:  Pt did not attend group.     Kandiss Ihrig, LRT/CTRS         Yuette Putnam A 06/06/2018 10:38 AM 

## 2018-06-06 NOTE — B[facility] Group Notes (Signed)
The focus of this group is to help patients establish daily goals to achieve during treatment and discuss how the patient can incorporate goal setting into their daily lives to aide in recovery.  Pt could not attend goals group due to being on droplet precautions.

## 2018-06-06 NOTE — [facility] Suicide Risk Assessment (Signed)
[facility] INPATIENT:  Family/Significant Other Suicide Prevention Education  Suicide Prevention Education:  Patient Refusal for Family/Significant Other Suicide Prevention Education: The patient Gabrielle Carter has refused to provide written consent for family/significant other to be provided Family/Significant Other Suicide Prevention Education during admission and/or prior to discharge.  Physician notified.  SPE completed with patient, as patient refused to consent to family contact. SPI pamphlet provided to pt and pt was encouraged to share information with support network, ask questions, and talk about any concerns relating to SPE. Patient denies access to guns/firearms and verbalized understanding of information provided. Mobile Crisis information also provided to patient.     [name] 06/06/2018, 10:56 AM

## 2018-06-06 NOTE — Tx Team (Signed)
Interdisciplinary Treatment and Diagnostic Plan Update  06/06/2018 Time of Session:  Gabrielle Carter MRN: [id number]  Principal Diagnosis: Schizoaffective disorder [facility])  Secondary Diagnoses: Principal Problem:   Schizoaffective disorder ([place]) Active Problems:   Wheezing   Current Medications:  Current Facility-Administered Medications  Medication Dose Route Frequency Provider Last Rate Last Dose  . acetaminophen (TYLENOL) tablet 650 mg  650 mg Oral Q6H PRN Lindon Romp A, NP      . albuterol (PROVENTIL HFA;VENTOLIN HFA) 108 (90 Base) MCG/ACT inhaler 2 puff  2 puff Inhalation Q6H PRN Cobos, Myer Peer, MD      . alum & mag hydroxide-simeth (MAALOX/MYLANTA) 200-200-20 MG/5ML suspension 30 mL  30 mL Oral Q4H PRN Lindon Romp A, NP      . azithromycin (ZITHROMAX) tablet 250 mg  250 mg Oral Daily Sharma Covert, MD   250 mg at 06/06/18 0744  . feeding supplement (ENSURE ENLIVE) (ENSURE ENLIVE) liquid 237 mL  237 mL Oral BID BM Cobos, Myer Peer, MD   237 mL at 06/05/18 1545  . hydrOXYzine (ATARAX/VISTARIL) tablet 25 mg  25 mg Oral TID PRN Rozetta Nunnery, NP      . levothyroxine (SYNTHROID, LEVOTHROID) tablet 50 mcg  50 mcg Oral Q0600 Sharma Covert, MD      . magnesium hydroxide (MILK OF MAGNESIA) suspension 30 mL  30 mL Oral Daily PRN Lindon Romp A, NP      . nicotine (NICODERM CQ - dosed in mg/24 hours) patch 21 mg  21 mg Transdermal Daily Cobos, Myer Peer, MD   21 mg at 06/06/18 0744  . risperiDONE (RISPERDAL) tablet 1 mg  1 mg Oral QHS Sharma Covert, MD      . sertraline (ZOLOFT) tablet 75 mg  75 mg Oral Daily Sharma Covert, MD   75 mg at 06/06/18 0744  . traZODone (DESYREL) tablet 50 mg  50 mg Oral QHS PRN Rozetta Nunnery, NP       PTA Medications: Medications Prior to Admission  Medication Sig Dispense Refill Last Dose  . FLUOXETINE HCL PO Take by mouth daily.      Marland Kitchen RISPERIDONE PO Take by mouth.       Patient Stressors: Medication change or  noncompliance Occupational concerns Substance abuse  Patient Strengths: Average or above average intelligence Capable of independent living Communication skills General fund of knowledge Motivation for treatment/growth Physical Health Supportive family/friends  Treatment Modalities: Medication Management, Group therapy, Case management,  1 to 1 session with clinician, Psychoeducation, Recreational therapy.   Physician Treatment Plan for Primary Diagnosis: Schizoaffective disorder ([place]) Long Term Goal(s): Improvement in symptoms so as ready for discharge Improvement in symptoms so as ready for discharge   Short Term Goals: Ability to identify changes in lifestyle to reduce recurrence of condition will improve Ability to verbalize feelings will improve Ability to disclose and discuss suicidal ideas Ability to demonstrate self-control will improve Ability to identify and develop effective coping behaviors will improve  Medication Management: Evaluate patient's response, side effects, and tolerance of medication regimen.  Therapeutic Interventions: 1 to 1 sessions, Unit Group sessions and Medication administration.  Evaluation of Outcomes: Not Met  Physician Treatment Plan for Secondary Diagnosis: Principal Problem:   Schizoaffective disorder ([place]) Active Problems:   Wheezing  Long Term Goal(s): Improvement in symptoms so as ready for discharge Improvement in symptoms so as ready for discharge   Short Term Goals: Ability to identify changes in lifestyle to reduce recurrence of condition  will improve Ability to verbalize feelings will improve Ability to disclose and discuss suicidal ideas Ability to demonstrate self-control will improve Ability to identify and develop effective coping behaviors will improve     Medication Management: Evaluate patient's response, side effects, and tolerance of medication regimen.  Therapeutic Interventions: 1 to 1 sessions, Unit Group  sessions and Medication administration.  Evaluation of Outcomes: Not Met   RN Treatment Plan for Primary Diagnosis: Schizoaffective disorder ([place]) Long Term Goal(s): Knowledge of disease and therapeutic regimen to maintain health will improve  Short Term Goals: Ability to participate in decision making will improve, Ability to verbalize feelings will improve, Ability to disclose and discuss suicidal ideas and Ability to identify and develop effective coping behaviors will improve  Medication Management: RN will administer medications as ordered by provider, will assess and evaluate patient's response and provide education to patient for prescribed medication. RN will report any adverse and/or side effects to prescribing provider.  Therapeutic Interventions: 1 on 1 counseling sessions, Psychoeducation, Medication administration, Evaluate responses to treatment, Monitor vital signs and CBGs as ordered, Perform/monitor CIWA, COWS, AIMS and Fall Risk screenings as ordered, Perform wound care treatments as ordered.  Evaluation of Outcomes: Not Met   LCSW Treatment Plan for Primary Diagnosis: Schizoaffective disorder ([place]) Long Term Goal(s): Safe transition to appropriate next level of care at discharge, Engage patient in therapeutic group addressing interpersonal concerns.  Short Term Goals: Engage patient in aftercare planning with referrals and resources  Therapeutic Interventions: Assess for all discharge needs, 1 to 1 time with Social worker, Explore available resources and support systems, Assess for adequacy in community support network, Educate family and significant other(s) on suicide prevention, Complete Psychosocial Assessment, Interpersonal group therapy.  Evaluation of Outcomes: Not Met   Progress in Treatment: Attending groups: No. Participating in groups: No. Taking medication as prescribed: Yes. Toleration medication: Yes. Family/Significant other contact made: No, will  contact:  patient declined collateral contacts at this time Patient understands diagnosis: Yes. Discussing patient identified problems/goals with staff: Yes. Medical problems stabilized or resolved: No. Denies suicidal/homicidal ideation: No. Passive SI Issues/concerns per patient self-inventory: No Other:   New problem(s) identified: T3 1.4 and T4 0.28. Patient denies known history of thyroid problems. She is going for thyroid ultrasound today.  New Short Term/Long Term Goal(s): medication stabilization, elimination of SI thoughts, development of comprehensive mental wellness plan.    Patient Goals:    Discharge Plan or Barriers: Patient has a history of following up at [facility] for outpatient services. CSW will continue to follow and assess for any additional referrals and possible discharge planning   Reason for Continuation of Hospitalization: Depression Medication stabilization Suicidal ideation  Estimated Length of Stay: 06/08/2018  Attendees: Patient: 06/06/2018 11:08 AM  Physician: Dr. Myles Lipps, MD 06/06/2018 11:08 AM  Nursing: Legrand Como.Chauncey Cruel, RN  06/06/2018 11:08 AM  RN Care Manager: 06/06/2018 11:08 AM  Social Worker: Radonna Ricker, [place] 06/06/2018 11:08 AM  Recreational Therapist:  06/06/2018 11:08 AM  Other:  06/06/2018 11:08 AM  Other:  06/06/2018 11:08 AM  Other: 06/06/2018 11:08 AM    Scribe for Treatment Team: Marylee Floras, [place] 06/06/2018 11:08 AM

## 2018-06-06 NOTE — Plan of Care (Signed)
Progress note  D: pt found in bed; compliant with medication administration. Pt has complaints of insomnia. Pt denies any other symptoms. "I slept wrong". Pt compliant with treatment plan regarding having an ultrasound of her thyroid. Pt denies any physical pain, rating this a 0/10. Pt denies si/hi/ah/vh and verbally agrees to approach staff if these become apparent or before harming herself/others while at bhh. "I haven't had any today, and that's why i'm sleeping; to not see things".  A: pt provided support and encouragement. Pt given medication per protocol and standing orders. Q7m safety checks implemented and continued.  R: pt safe on the unit. Will continue to monitor.   Pt progressing in the following metrics  Problem: Education: Goal: Knowledge of [place] [facility] Education information/materials will improve Outcome: Progressing Goal: Mental status will improve Outcome: Progressing Goal: Verbalization of understanding the information provided will improve Outcome: Progressing   Problem: Activity: Goal: Interest or engagement in activities will improve Outcome: Progressing

## 2018-06-07 LAB — THYROID PEROXIDASE ANTIBODY: Thyroperoxidase Ab SerPl-aCnc: 538 IU/mL — ABNORMAL HIGH (ref 0–34)

## 2018-06-07 LAB — THYROID STIMULATING IMMUNOGLOBULIN: Thyroid Stimulating Immunoglob: <0.1 IU/L (ref 0.00–0.55)
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
## 2018-06-07 NOTE — Progress Notes (Signed)
D   Pt taken off of droplet precautions and has been happy according to her verbalizations to be out of her room    She was in bed when this writer went to talk to her and prompt her to get medications    She did say she couldn't fall asleep and said she did sleep good last night after she got her medications A    Verbal support given   Medications administered and effectiveness monitored   Q 15 min checks R   Pt is safe at this time

## 2018-06-07 NOTE — Progress Notes (Signed)
D:  Patient's self inventory sheet, patient sleeps good, sleep medication helpful.  Good appetite, normal energy level, good concentration.  Denied depression, hopeless and anxiety.  Denied withdrawals.  Denied SI.  Denied physical problems.  Denied physical pain.  Goal is discharge to loving husband.  Plans to start taking medicine everyday.  "Thank you guys for helping me."  Does have discharge plans. A:  Medications administered per MD orders.  Emotional support and encouragement given patient. R:  Denied SI and HI, contracts for safety.  Denied A/V hallucinations.  Safety maintained with 15 minute checks.

## 2018-06-07 NOTE — Progress Notes (Signed)
Adult Psychoeducational Group Note  Date:  06/07/2018 Time:  9:26 PM  Group Topic/Focus:  Wrap-Up Group:   The focus of this group is to help patients review their daily goal of treatment and discuss progress on daily workbooks.  Participation Level:  Active  Participation Quality:  Appropriate  Affect:  Appropriate  Cognitive:  Appropriate  Insight: Appropriate  Engagement in Group:  Engaged  Modes of Intervention:  Discussion  Additional Comments:  Pt stated her goal was to get back on meds and go back home to her family.  Pt stated that she did meet her goals.  Pt rated the day at a 9/10.  Gabrielle Carter 06/07/2018, 9:26 PM

## 2018-06-07 NOTE — Plan of Care (Signed)
Nurse discussed anxiety, depression and coping skills with patient.  

## 2018-06-07 NOTE — [facility] Suicide Risk Assessment (Signed)
[facility] INPATIENT:  Family/Significant Other Suicide Prevention Education  Suicide Prevention Education:  Education Completed; with husband, Zudora Schiferl [phone]) has been identified by the patient as the family member/significant other with whom the patient will be residing, and identified as the person(s) who will aid the patient in the event of a mental health crisis (suicidal ideations/suicide attempt).  With written consent from the patient, the family member/significant other has been provided the following suicide prevention education, prior to the and/or following the discharge of the patient.  The suicide prevention education provided includes the following:  Suicide risk factors  Suicide prevention and interventions  National Suicide Hotline telephone number  [facility] assessment telephone number  [facility] Emergency Assistance 911  [facility] and/or Residential Mobile Crisis Unit telephone number  Request made of family/significant other to:  Remove weapons (e.g., guns, rifles, knives), all items previously/currently identified as safety concern.    Remove drugs/medications (over-the-counter, prescriptions, illicit drugs), all items previously/currently identified as a safety concern.  The family member/significant other verbalizes understanding of the suicide prevention education information provided.  The family member/significant other agrees to remove the items of safety concern listed above.  Gerilyn Nestle reports that he spoke with the MD and that he does not have any additional questions or concerns at this time. He reports that he will ensure the patient follows up for both medical and psychiatric follow up appointments. Gerilyn Nestle reports he does not have any safety concerns regarding the patient returning home at discharge.   [name] 06/07/2018, 11:02 AM

## 2018-06-07 NOTE — Progress Notes (Signed)
[facility]  06/07/2018 10:04 AM Jermiya Nevin BloodgoodLynn Digeronimo  MRN:  [id number] Subjective:   Patient is a 43 year old female who stated she has a history of schizophrenia, bipolar disorder, depression, anxiety and suicidal ideation.  [facility]/04/2018 with suicidal ideation.  The patient stated that she had called 911 for help on the date of admission because she was having suicidal thoughts.  She had several suicide attempts in the past.   Objective: Patient is seen and examined.  Patient is a 43 year old female with the above-stated past psychiatric history seen in follow-up.  She is doing better both medically and psychiatrically.  She was started on Synthroid 50 mcg p.o. daily yesterday.  Her thyroid ultrasound revealed a significantly atrophic thyroid gland most likely secondary to thyroiditis.  Her thyroid peroxidase was obtained which was significantly elevated at 538.  Her thyroid-stimulating antibody is still pending.  She has been afebrile for more than 24 hours.  Her cough is improved significantly.  She stated her mood was better.  She admitted this morning that she had run out of her medications, and was unable to obtain them and then came to the hospital for admission because of the lack of these medicines.  She stated the restart of the Risperdal and Zoloft have been significantly helpful.  She is on day 3 of her azithromycin.  Her blood pressure stable at 101/53.  She is bradycardic with a rate of 41 this morning.  Her temperature this morning was 97.6.  Unfortunately we do not have an EKG on her in the chart.  She did sleep 6.5 hours last night.  She denied any suicidal ideation or side effects to her current medications.  We also discussed the importance of medical follow-up after her discharge with regard to her bronchitis as well as her hypothyroidism.  Principal Problem: Schizoaffective disorder (HCC) Diagnosis: Principal Problem:  Schizoaffective disorder (HCC) Active Problems:   Wheezing  Total Time spent with patient: 20 minutes  Past Psychiatric History: See admission H&P  Past Medical History:  Past Medical History:  Diagnosis Date  . Asthma    History reviewed. No pertinent surgical history. Family History: History reviewed. No pertinent family history. Family Psychiatric  History: See admission H&P Social History:  Social History   Substance and Sexual Activity  Alcohol Use Yes  . Alcohol/week: 2.0 standard drinks  . Types: 2 Standard drinks or equivalent per week   Comment: monthly or less     Social History   Substance and Sexual Activity  Drug Use Yes  . Types: Cocaine, Marijuana    Social History   Socioeconomic History  . Marital status: Legally Separated    Spouse name: Sonia BallerRamon Novick  . Number of children: Not on file  . Years of education: Not on file  . Highest education level: Not on file  Occupational History  . Not on file  Social Needs  . Financial resource strain: Not on file  . Food insecurity:    Worry: Not on file    Inability: Not on file  . Transportation needs:    Medical: Not on file    Non-medical: Not on file  Tobacco Use  . Smoking status: Current Every Day Smoker    Packs/day: 1.50    Types: Cigarettes  . Smokeless tobacco: Never Used  Substance and Sexual Activity  . Alcohol use: Yes    Alcohol/week: 2.0 standard drinks    Types: 2  Standard drinks or equivalent per week    Comment: monthly or less  . Drug use: Yes    Types: Cocaine, Marijuana  . Sexual activity: Yes  Lifestyle  . Physical activity:    Days per week: Not on file    Minutes per session: Not on file  . Stress: Not on file  Relationships  . Social connections:    Talks on phone: Not on file    Gets together: Not on file    Attends religious service: Not on file    Active member of club or organization: Not on file    Attends meetings of clubs or organizations: Not on file     Relationship status: Not on file  Other Topics Concern  . Not on file  Social History Narrative  . Not on file   Additional Social History:    Pain Medications: None Prescriptions: Fluoxetine HCI  in AM  last taken 04/03; Olanzapine  daily last taken 04/03 Over the Counter: Unknown History of alcohol / drug use?: Yes Name of Substance 1: Marijuana 1 - Age of First Use: 43 years of age 62 - Amount (size/oz): One joint  1 - Frequency: Every other day  1 - Duration: on-going 1 - Last Use / Amount: Unknown                  Sleep: Good  Appetite:  Fair  Current Medications: Current Facility-Administered Medications  Medication Dose Route Frequency Provider Last Rate Last Dose  . acetaminophen (TYLENOL) tablet 650 mg  650 mg Oral Q6H PRN Nira Conn A, NP      . albuterol (PROVENTIL HFA;VENTOLIN HFA) 108 (90 Base) MCG/ACT inhaler 2 puff  2 puff Inhalation Q6H PRN Cobos, Rockey Situ, MD      . alum & mag hydroxide-simeth (MAALOX/MYLANTA) 200-200-20 MG/5ML suspension 30 mL  30 mL Oral Q4H PRN Nira Conn A, NP      . azithromycin (ZITHROMAX) tablet 250 mg  250 mg Oral Daily Antonieta Pert, MD   250 mg at 06/07/18 0818  . feeding supplement (ENSURE ENLIVE) (ENSURE ENLIVE) liquid 237 mL  237 mL Oral BID BM Cobos, Rockey Situ, MD   237 mL at 06/07/18 0819  . hydrOXYzine (ATARAX/VISTARIL) tablet 25 mg  25 mg Oral TID PRN Jackelyn Poling, NP      . levothyroxine (SYNTHROID, LEVOTHROID) tablet 50 mcg  50 mcg Oral Q0600 Antonieta Pert, MD   50 mcg at 06/07/18 0640  . magnesium hydroxide (MILK OF MAGNESIA) suspension 30 mL  30 mL Oral Daily PRN Nira Conn A, NP      . nicotine (NICODERM CQ - dosed in mg/24 hours) patch 21 mg  21 mg Transdermal Daily Cobos, Rockey Situ, MD   21 mg at 06/07/18 0818  . risperiDONE (RISPERDAL) tablet 1 mg  1 mg Oral QHS Antonieta Pert, MD   1 mg at 06/06/18 2110  . sertraline (ZOLOFT) tablet 75 mg  75 mg Oral Daily Antonieta Pert, MD    75 mg at 06/07/18 0818  . traZODone (DESYREL) tablet 50 mg  50 mg Oral QHS PRN Jackelyn Poling, NP   50 mg at 06/06/18 2110    Lab Results:  Results for orders placed or performed during the hospital encounter of 06/04/18 (from the past 48 hour(s))  Influenza panel by PCR (type A & B)     Status: None   Collection Time: 06/05/18 10:37 AM  Result  Value Ref Range   Influenza A By PCR NEGATIVE NEGATIVE   Influenza B By PCR NEGATIVE NEGATIVE    Comment: (NOTE) The Xpert Xpress Flu assay is intended as an aid in the diagnosis of  influenza and should not be used as a sole basis for treatment.  This  assay is FDA approved for nasopharyngeal swab specimens only. Nasal  washings and aspirates are unacceptable for Xpert Xpress Flu testing. Performed at [facility], [address] [place], [place] [address]   T3, free     Status: Abnormal   Collection Time: 06/05/18  6:50 PM  Result Value Ref Range   T3, Free 1.4 (L) 2.0 - 4.4 pg/mL    Comment: (NOTE) Performed At: [facility] [address] [place], [place] [id number] Jolene Schimke MD [id number]   T4, free     Status: Abnormal   Collection Time: 06/05/18  6:50 PM  Result Value Ref Range   Free T4 0.28 (L) 0.82 - 1.77 ng/dL    Comment: (NOTE) Biotin ingestion may interfere with free T4 tests. If the results are inconsistent with the TSH level, previous test results, or the clinical presentation, then consider biotin interference. If needed, order repeat testing after stopping biotin. Performed at [facility], [address], [place], [place] [address]   Thyroid peroxidase antibody     Status: Abnormal   Collection Time: 06/06/18  6:29 PM  Result Value Ref Range   Thyroperoxidase Ab SerPl-aCnc 538 (H) 0 - 34 IU/mL    Comment: (NOTE) Performed At: [facility] [address] [place], [place] [id number] Jolene Schimke MD [id number]     Blood Alcohol level:  No results found  for: [facility]  Metabolic Disorder Labs: Lab Results  Component Value Date   HGBA1C 5.7 (H) 06/05/2018   MPG 116.89 06/05/2018   No results found for: PROLACTIN Lab Results  Component Value Date   CHOL 177 06/05/2018   TRIG 123 06/05/2018   HDL 30 (L) 06/05/2018   CHOLHDL 5.9 06/05/2018   VLDL 25 06/05/2018   LDLCALC 122 (H) 06/05/2018    Physical Findings: AIMS: Facial and Oral Movements Muscles of Facial Expression: None, normal Lips and Perioral Area: None, normal Jaw: None, normal Tongue: None, normal,Extremity Movements Upper (arms, wrists, hands, fingers): None, normal Lower (legs, knees, ankles, toes): None, normal, Trunk Movements Neck, shoulders, hips: None, normal, Overall Severity Severity of abnormal movements (highest score from questions above): None, normal Incapacitation due to abnormal movements: None, normal Patient's awareness of abnormal movements (rate only patient's report): No Awareness, Dental Status Current problems with teeth and/or dentures?: No Does patient usually wear dentures?: No  CIWA:  CIWA-Ar Total: 1 COWS:  COWS Total Score: 1  Musculoskeletal: Strength & Muscle Tone: within normal limits Gait & Station: normal Patient leans: N/A  Psychiatric Specialty Exam: Physical Exam  Nursing note and vitals reviewed. Constitutional: She is oriented to person, place, and time. She appears well-developed and well-nourished.  HENT:  Head: Normocephalic and atraumatic.  Respiratory: Effort normal.  Neurological: She is alert and oriented to person, place, and time.    ROS  Blood pressure (!) 101/53, pulse (!) 41, temperature 97.6 F (36.4 C), resp. rate 18, height  (1.803 m), weight 94.8 kg, last menstrual period 06/03/2018.Body mass index is 29.15 kg/m.  General Appearance: Casual  Eye Contact:  Fair  Speech:  Normal Rate  Volume:  Normal  Mood:  Euthymic  Affect:  Congruent  Thought Process:  Coherent and Descriptions of Associations:  Intact  Orientation:  Full (Time, Place, and Person)  Thought Content:  Logical  Suicidal Thoughts:  No  Homicidal Thoughts:  No  Memory:  Immediate;   Fair Recent;   Fair Remote;   Fair  Judgement:  Intact  Insight:  Fair  Psychomotor Activity:  Normal  Concentration:  Concentration: Fair and Attention Span: Fair  Recall:  [employer] of Knowledge:  Fair  Language:  Good  Akathisia:  Negative  Handed:  Right  AIMS (if indicated):     Assets:  Desire for Improvement Housing Resilience  ADL's:  Intact  Cognition:  WNL  Sleep:  Number of Hours: 6.5     Treatment Plan Summary: Daily contact with patient to assess and evaluate symptoms and progress in treatment, Medication management and Plan : Patient is seen and examined.  Patient is a 43 year old female with the above-stated past psychiatric history who is seen in follow-up.   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  Patient is doing much better today.  Mood and affect are improved and physically she feels better.  No change in the Risperdal or the Zoloft at this time.  She has 2 more days of azithromycin pending.  That will be continued.  She was started on levothyroxine 50 mcg p.o. daily and she will need follow-up with primary care and endocrinology afterwards given her diagnosis.  She is significantly bradycardic, and we will get an EKG today to make sure about that given the Risperdal.  No other changes in her medications at this point. 1.  Continue albuterol inhaler 2 puffs every 6 hours as needed wheezing or shortness of breath. 2.  Continue azithromycin 250 mg p.o. daily for pulmonary infection. 3.  Continue levothyroxine 50 mcg p.o. daily for hypothyroidism. 4.  Continue Risperdal 1 mg p.o. nightly for psychosis and mood stability. 5.  Continue sertraline 75 mg p.o.  daily for mood and anxiety. 6.  Continue trazodone 50 mg p.o. nightly as needed insomnia. 7.  EKG today for bradycardia. 8.  Disposition planning-in progress.  Antonieta Pert, MD 06/07/2018, 10:04 AM

## 2018-06-08 MED ORDER — LEVOTHYROXINE SODIUM 50 MCG PO TABS: 50.0000 ug | ORAL_TABLET | Freq: Every day | ORAL | 0 refills | Status: DC

## 2018-06-08 MED ORDER — AZITHROMYCIN 250 MG PO TABS: ORAL_TABLET | ORAL | 0 refills | Status: DC

## 2018-06-08 MED ORDER — ALBUTEROL SULFATE HFA 108 (90 BASE) MCG/ACT IN AERS: 2.0000 | INHALATION_SPRAY | Freq: Four times a day (QID) | RESPIRATORY_TRACT | 0 refills | Status: AC | PRN

## 2018-06-08 MED ORDER — SERTRALINE HCL 25 MG PO TABS: 75.0000 mg | ORAL_TABLET | Freq: Every day | ORAL | 0 refills | Status: DC

## 2018-06-08 MED ORDER — RISPERIDONE 1 MG PO TABS: 1.0000 mg | ORAL_TABLET | Freq: Every day | ORAL | 0 refills | Status: DC

## 2018-06-08 MED ORDER — TRAZODONE HCL 50 MG PO TABS: 50.0000 mg | ORAL_TABLET | Freq: Every evening | ORAL | 0 refills | Status: DC | PRN

## 2018-06-08 MED ORDER — NICOTINE 21 MG/24HR TD PT24: 21.0000 mg | MEDICATED_PATCH | Freq: Every day | TRANSDERMAL | 0 refills | Status: DC

## 2018-06-08 NOTE — Progress Notes (Signed)
  [facility] Adult Case Management Discharge Plan :  Will you be returning to the same living situation after discharge:  Yes,  home At discharge, do you have transportation home?: Yes,  husband picking up Do you have the ability to pay for your medications: No. Referred to [facility]  Release of information consent forms completed and in the chart. Patient to Follow up at: Follow-up Information    [facility] Follow up on 06/12/2018.   Why:  Hospital follow up appointment is Tuesday, 4/14 at 10:30a.  At this time the appointment will be held over the phone.  The therapist will call the patient.  Contact information: [address] [place] [address] [phone]        [facility] Follow up.   Why:  Office is closed for Good Friday, please contact agency on Monday, 4/13 to schedule a primary care appointment.  Contact information: [address] [place] [address] [phone]          Next level of care provider has access to [facility] Link:yes  Safety Planning and Suicide Prevention discussed: Yes,  with husband  Have you used any form of tobacco in the last 30 days? (Cigarettes, Smokeless Tobacco, Cigars, and/or Pipes): Yes  Has patient been referred to the Quitline?: Patient refused referral  Patient has been referred for addiction treatment: Yes  Darreld Mclean, LCSWA 06/08/2018, 9:44 AM

## 2018-06-08 NOTE — BHH Suicide Risk Assessment (Signed)
[facility] Discharge Suicide Risk Assessment   Principal Problem: Schizoaffective disorder [facility]) Discharge Diagnoses: Principal Problem:   Schizoaffective disorder (HCC) Active Problems:   Wheezing   Total Time spent with patient: 15 minutes  Musculoskeletal: Strength & Muscle Tone: within normal limits Gait & Station: normal Patient leans: N/A  Psychiatric Specialty Exam: Review of Systems  All other systems reviewed and are negative.   Blood pressure 102/63, pulse 74, temperature 98.7 F (37.1 C), resp. rate 18, height 5\' 11"  (1.803 m), weight 94.8 kg, last menstrual period 06/03/2018.Body mass index is 29.15 kg/m.  General Appearance: Casual  Eye Contact::  Good  Speech:  Normal Rate409  Volume:  Normal  Mood:  Euthymic  Affect:  Congruent  Thought Process:  Coherent and Descriptions of Associations: Intact  Orientation:  Full (Time, Place, and Person)  Thought Content:  Logical  Suicidal Thoughts:  No  Homicidal Thoughts:  No  Memory:  Immediate;   Fair Recent;   Fair Remote;   Fair  Judgement:  Intact  Insight:  Fair  Psychomotor Activity:  Normal  Concentration:  Good  Recall:  Good  Fund of Knowledge:Fair  Language: Good  Akathisia:  Negative  Handed:  Right  AIMS (if indicated):     Assets:  Desire for Improvement Housing Resilience Social Support  Sleep:  Number of Hours: 6.75  Cognition: WNL  ADL's:  Intact   Mental Status Per Nursing Assessment::   On Admission:  Suicidal ideation indicated by patient, Suicide plan, Plan includes specific time, place, or method, Self-harm thoughts, Self-harm behaviors, Intention to act on suicide plan, Belief that plan would result in death, Thoughts of violence towards others  Demographic Factors:  Caucasian, Low socioeconomic status and Unemployed  Loss Factors: Decline in physical health  Historical Factors: Impulsivity  Risk Reduction Factors:   Sense of responsibility to family, Living with another person,  especially a relative and Positive social support  Continued Clinical Symptoms:  Depression:   Comorbid alcohol abuse/dependence Impulsivity Alcohol/Substance Abuse/Dependencies Schizophrenia:   Depressive state  Cognitive Features That Contribute To Risk:  None    Suicide Risk:  Minimal: No identifiable suicidal ideation.  Patients presenting with no risk factors but with morbid ruminations; may be classified as minimal risk based on the severity of the depressive symptoms  Follow-up Information    [facility] Follow up on 06/12/2018.   Why:  Hospital follow up appointment is Tuesday, 4/14 at 10:30a.  At this time the appointment will be held over the phone.  The therapist will call the patient.  Contact information: [address] [place] [address] [phone]        [facility] Follow up.   Contact information: [address] [place] [address] [phone]          Plan Of Care/Follow-up recommendations:  Activity:  ad lib  Antonieta Pert, MD 06/08/2018, 9:11 AM

## 2018-06-08 NOTE — Progress Notes (Signed)
Recreation Therapy Notes  Date:  4.10.20 Time: 0930 Location: [address] Dayroom  Group Topic: Stress Management  Goal Area(s) Addresses:  Patient will identify positive stress management techniques. Patient will identify benefits of using stress management post d/c.  Behavioral Response:  Engaged  Intervention: Stress Management  Activity :  Meditation.  LRT introduced the stress management technique of meditation.  LRT played a meditation that focused on resilience.  Patients were to listen and follow along as meditation played to fully engage in activity.  Education:  Stress Management, Discharge Planning.   Education Outcome: Acknowledges Education  Clinical Observations/Feedback:  Pt attended and participated in group.    Caroll Rancher, LRT/CTRS         Caroll Rancher A 06/08/2018 11:38 AM

## 2018-06-08 NOTE — Progress Notes (Signed)
Recreation Therapy Notes  Date:  4.10.20 Time: 0930 Location: [address] Dayroom  Group Topic: Stress Management  Goal Area(s) Addresses:  Patient will identify positive stress management techniques. Patient will identify benefits of using stress management post d/c.  Behavioral Response:  Engaged  Intervention: Stress Management  Activity :  Meditation.  LRT introduced the stress management technique of meditation.  LRT played a meditation that focused on resilience.  Patients were to listen and follow along as meditation played to fully engage in activity.  Education:  Stress Management, Discharge Planning.   Education Outcome: Acknowledges Education  Clinical Observations/Feedback:  Pt attended and participated in group.    Caroll Rancher, LRT/CTRS         Caroll Rancher A 06/08/2018 11:37 AM

## 2018-06-08 NOTE — Plan of Care (Addendum)
Discharge note  Patient verbalizes readiness for discharge. Follow up plan explained, AVS, Transition record and SRA given. Prescriptions and teaching provided. Belongings returned and signed for. Suicide safety plan completed and signed. Patient verbalizes understanding. Patient denies SI/HI and assures this writer she will seek assistance should that change. Patient discharged to lobby where husband was waiting.  Problem: Education: Goal: Knowledge of [facility] Education information/materials will improve Outcome: Adequate for Discharge Goal: Mental status will improve Outcome: Adequate for Discharge Goal: Verbalization of understanding the information provided will improve Outcome: Adequate for Discharge   Problem: Activity: Goal: Interest or engagement in activities will improve Outcome: Adequate for Discharge Goal: Sleeping patterns will improve Outcome: Adequate for Discharge   Problem: Coping: Goal: Ability to demonstrate self-control will improve Outcome: Adequate for Discharge   Problem: Health Behavior/Discharge Planning: Goal: Identification of resources available to assist in meeting health care needs will improve Outcome: Adequate for Discharge   Problem: Safety: Goal: Periods of time without injury will increase Outcome: Adequate for Discharge   Problem: Medication: Goal: Compliance with prescribed medication regimen will improve Outcome: Adequate for Discharge   Problem: Self-Concept: Goal: Ability to disclose and discuss suicidal ideas will improve Outcome: Adequate for Discharge   Problem: Activity: Goal: Will verbalize the importance of balancing activity with adequate rest periods Outcome: Adequate for Discharge   Problem: Education: Goal: Will be free of psychotic symptoms Outcome: Adequate for Discharge   Problem: Nutritional: Goal: Ability to achieve adequate nutritional intake will improve Outcome: Adequate for Discharge   Problem:  Education: Goal: Utilization of techniques to improve thought processes will improve Outcome: Adequate for Discharge   Problem: Health Behavior/Discharge Planning: Goal: Ability to make decisions will improve Outcome: Adequate for Discharge   Problem: Safety: Goal: Ability to identify and utilize support systems that promote safety will improve Outcome: Adequate for Discharge

## 2018-06-08 NOTE — BHH Suicide Risk Assessment (Signed)
[facility] Discharge Suicide Risk Assessment   Principal Problem: Schizoaffective disorder [facility]) Discharge Diagnoses: Principal Problem:   Schizoaffective disorder (HCC) Active Problems:   Wheezing   Total Time spent with patient: 15 minutes  Musculoskeletal: Strength & Muscle Tone: within normal limits Gait & Station: normal Patient leans: N/A  Psychiatric Specialty Exam: Review of Systems  All other systems reviewed and are negative.   Blood pressure 102/63, pulse 74, temperature 98.7 F (37.1 C), resp. rate 18, height 5\' 11"  (1.803 m), weight 94.8 kg, last menstrual period 06/03/2018.Body mass index is 29.15 kg/m.  General Appearance: Casual  Eye Contact::  Good  Speech:  Normal Rate409  Volume:  Normal  Mood:  Euthymic  Affect:  Congruent  Thought Process:  Coherent and Descriptions of Associations: Intact  Orientation:  Full (Time, Place, and Person)  Thought Content:  Logical  Suicidal Thoughts:  No  Homicidal Thoughts:  No  Memory:  Immediate;   Fair Recent;   Fair Remote;   Fair  Judgement:  Intact  Insight:  Fair  Psychomotor Activity:  Normal  Concentration:  Fair  Recall:  [employer] of Knowledge:Fair  Language: Fair  Akathisia:  Negative  Handed:  Right  AIMS (if indicated):     Assets:  Desire for Improvement Housing Intimacy Leisure Time Resilience  Sleep:  Number of Hours: 6.75  Cognition: WNL  ADL's:  Intact   Mental Status Per Nursing Assessment::   On Admission:  Suicidal ideation indicated by patient, Suicide plan, Plan includes specific time, place, or method, Self-harm thoughts, Self-harm behaviors, Intention to act on suicide plan, Belief that plan would result in death, Thoughts of violence towards others  Demographic Factors:  Caucasian, Low socioeconomic status and Unemployed  Loss Factors: NA  Historical Factors: Impulsivity  Risk Reduction Factors:   Sense of responsibility to family, Living with another person, especially a  relative and Positive social support  Continued Clinical Symptoms:  Bipolar Disorder:   Mixed State Alcohol/Substance Abuse/Dependencies Schizophrenia:   Depressive state  Cognitive Features That Contribute To Risk:  None    Suicide Risk:  Minimal: No identifiable suicidal ideation.  Patients presenting with no risk factors but with morbid ruminations; may be classified as minimal risk based on the severity of the depressive symptoms  Follow-up Information    [facility] Follow up on 06/12/2018.   Why:  Hospital follow up appointment is Tuesday, 4/14 at 10:30a.  At this time the appointment will be held over the phone.  The therapist will call the patient.  Contact information: [address] [place] [address] [phone]        [facility] Follow up.   Contact information: [address] [place] [address] [phone]          Plan Of Care/Follow-up recommendations:  Activity:  ad lib  Antonieta Pert, MD 06/08/2018, 9:03 AM

## 2018-06-08 NOTE — Progress Notes (Signed)
Pt did not attend goals/orientation group this morning.  
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
## 2019-08-21 ENCOUNTER — Encounter (HOSPITAL_COMMUNITY): Payer: Self-pay | Admitting: Behavioral Health

## 2019-08-21 ENCOUNTER — Other Ambulatory Visit: Payer: Self-pay | Admitting: Behavioral Health

## 2019-08-21 ENCOUNTER — Inpatient Hospital Stay (HOSPITAL_COMMUNITY)
Admit: 2019-08-21 | Discharge: 2019-08-26 | DRG: 885 | Disposition: A | Discharge status: Home / Self Care | Payer: Medicaid Other | Source: Intra-hospital | Attending: Psychiatry | Admitting: Psychiatry

## 2019-08-21 ENCOUNTER — Other Ambulatory Visit: Payer: Self-pay

## 2019-08-21 DIAGNOSIS — F250 Schizoaffective disorder, bipolar type: Principal | ICD-10-CM | POA: Diagnosis present

## 2019-08-21 DIAGNOSIS — R45851 Suicidal ideations: Secondary | ICD-10-CM | POA: Diagnosis present

## 2019-08-21 DIAGNOSIS — E039 Hypothyroidism, unspecified: Secondary | ICD-10-CM | POA: Diagnosis present

## 2019-08-21 DIAGNOSIS — G4700 Insomnia, unspecified: Secondary | ICD-10-CM | POA: Diagnosis present

## 2019-08-21 DIAGNOSIS — Z20822 Contact with and (suspected) exposure to covid-19: Secondary | ICD-10-CM | POA: Diagnosis present

## 2019-08-21 DIAGNOSIS — I10 Essential (primary) hypertension: Secondary | ICD-10-CM | POA: Diagnosis present

## 2019-08-21 DIAGNOSIS — F209 Schizophrenia, unspecified: Secondary | ICD-10-CM | POA: Diagnosis present

## 2019-08-21 DIAGNOSIS — F319 Bipolar disorder, unspecified: Secondary | ICD-10-CM | POA: Diagnosis present

## 2019-08-21 DIAGNOSIS — Z79899 Other long term (current) drug therapy: Secondary | ICD-10-CM

## 2019-08-21 DIAGNOSIS — F200 Paranoid schizophrenia: Secondary | ICD-10-CM | POA: Diagnosis not present

## 2019-08-21 DIAGNOSIS — K219 Gastro-esophageal reflux disease without esophagitis: Secondary | ICD-10-CM | POA: Diagnosis present

## 2019-08-21 DIAGNOSIS — F1420 Cocaine dependence, uncomplicated: Secondary | ICD-10-CM | POA: Diagnosis present

## 2019-08-21 DIAGNOSIS — F17210 Nicotine dependence, cigarettes, uncomplicated: Secondary | ICD-10-CM | POA: Diagnosis present

## 2019-08-21 DIAGNOSIS — J449 Chronic obstructive pulmonary disease, unspecified: Secondary | ICD-10-CM | POA: Diagnosis present

## 2019-08-21 DIAGNOSIS — Z79890 Hormone replacement therapy (postmenopausal): Secondary | ICD-10-CM

## 2019-08-21 DIAGNOSIS — F4310 Post-traumatic stress disorder, unspecified: Secondary | ICD-10-CM | POA: Diagnosis present

## 2019-08-21 DIAGNOSIS — R062 Wheezing: Secondary | ICD-10-CM

## 2019-08-21 MED ORDER — MAGNESIUM HYDROXIDE 400 MG/5ML PO SUSP
30.0000 mL | Freq: Every day | ORAL | Status: DC | PRN
  Administered 2019-08-25: 30 mL via ORAL

## 2019-08-21 MED ORDER — LORAZEPAM 1 MG PO TABS: 1.0000 mg | ORAL_TABLET | ORAL | Status: DC | PRN

## 2019-08-21 MED ORDER — TRAZODONE HCL 50 MG PO TABS
50.0000 mg | ORAL_TABLET | Freq: Every evening | ORAL | Status: DC | PRN
  Administered 2019-08-21 – 2019-08-25 (×5): 50 mg via ORAL
  Filled 2019-08-21 (×2): qty 1
  Filled 2019-08-21: qty 7
  Filled 2019-08-21 (×3): qty 1

## 2019-08-21 MED ORDER — ZIPRASIDONE MESYLATE 20 MG IM SOLR: 20.0000 mg | INTRAMUSCULAR | Status: DC | PRN

## 2019-08-21 MED ORDER — ACETAMINOPHEN 325 MG PO TABS
650.0000 mg | ORAL_TABLET | Freq: Four times a day (QID) | ORAL | Status: DC | PRN
  Administered 2019-08-24: 650 mg via ORAL
  Filled 2019-08-21: qty 2

## 2019-08-21 MED ORDER — SERTRALINE HCL 25 MG PO TABS
25.0000 mg | ORAL_TABLET | Freq: Every day | ORAL | Status: DC
  Administered 2019-08-21 – 2019-08-22 (×2): 25 mg via ORAL
  Filled 2019-08-21 (×6): qty 1

## 2019-08-21 MED ORDER — RISPERIDONE 2 MG PO TBDP: 2.0000 mg | ORAL_TABLET | Freq: Three times a day (TID) | ORAL | Status: DC | PRN

## 2019-08-21 MED ORDER — LEVOTHYROXINE SODIUM 50 MCG PO TABS
50.0000 ug | ORAL_TABLET | Freq: Every day | ORAL | Status: DC
  Administered 2019-08-22: 50 ug via ORAL
  Filled 2019-08-21: qty 1
  Filled 2019-08-21: qty 2
  Filled 2019-08-21 (×2): qty 1

## 2019-08-21 MED ORDER — RISPERIDONE 1 MG PO TBDP
1.0000 mg | ORAL_TABLET | Freq: Every day | ORAL | Status: DC
  Administered 2019-08-21: 1 mg via ORAL
  Filled 2019-08-21 (×4): qty 1

## 2019-08-21 MED ORDER — HYDROXYZINE HCL 25 MG PO TABS
25.0000 mg | ORAL_TABLET | Freq: Three times a day (TID) | ORAL | Status: DC | PRN
  Administered 2019-08-21 – 2019-08-22 (×2): 25 mg via ORAL
  Filled 2019-08-21 (×2): qty 1

## 2019-08-21 MED ORDER — ALBUTEROL SULFATE HFA 108 (90 BASE) MCG/ACT IN AERS: 1.0000 | INHALATION_SPRAY | Freq: Four times a day (QID) | RESPIRATORY_TRACT | Status: DC | PRN

## 2019-08-21 MED ORDER — ALUM & MAG HYDROXIDE-SIMETH 200-200-20 MG/5ML PO SUSP: 30.0000 mL | ORAL | Status: DC | PRN

## 2019-08-21 NOTE — BHH Group Notes (Signed)
Pt. Did not attend group.Pt was ask to attend by [facility].
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
## 2019-08-21 NOTE — Progress Notes (Signed)
Pt has been in bed this evening stating that she was not feeling well. Pt stated she has been having SI with a plan to cut her wrist with a knife. Pt stated laying down in bed made it better. Pt encouraged to speek with staff it get worse, pt contracted for safety. Medication given as scheduled, support encouragement offered as needed, will continue to monitor.

## 2019-08-22 DIAGNOSIS — F200 Paranoid schizophrenia: Secondary | ICD-10-CM

## 2019-08-22 LAB — HEMOGLOBIN A1C
Hgb A1c MFr Bld: 6.2 % — ABNORMAL HIGH (ref 4.8–5.6)
Mean Plasma Glucose: 131.24 mg/dL

## 2019-08-22 LAB — LIPID PANEL
Cholesterol: 234 mg/dL — ABNORMAL HIGH (ref 0–200)
HDL: 34 mg/dL — ABNORMAL LOW (ref >40)
LDL Cholesterol: 170 mg/dL — ABNORMAL HIGH (ref 0–99)
Total CHOL/HDL Ratio: 6.9 RATIO
Triglycerides: 152 mg/dL — ABNORMAL HIGH (ref <150)
VLDL: 30 mg/dL (ref 0–40)

## 2019-08-22 LAB — CARBAMAZEPINE LEVEL, TOTAL: Carbamazepine Lvl: <2 ug/mL — ABNORMAL LOW (ref 4.0–12.0)

## 2019-08-22 LAB — TSH: TSH: 47.702 u[IU]/mL — ABNORMAL HIGH (ref 0.350–4.500)

## 2019-08-22 MED ORDER — PANTOPRAZOLE SODIUM 40 MG PO TBEC
40.0000 mg | DELAYED_RELEASE_TABLET | Freq: Every day | ORAL | Status: DC
  Administered 2019-08-22 – 2019-08-26 (×5): 40 mg via ORAL
  Filled 2019-08-22 (×7): qty 1

## 2019-08-22 MED ORDER — OLANZAPINE 10 MG PO TBDP
10.0000 mg | ORAL_TABLET | Freq: Every day | ORAL | Status: DC
  Administered 2019-08-22: 10 mg via ORAL
  Filled 2019-08-22: qty 1

## 2019-08-22 MED ORDER — FLUOXETINE HCL 20 MG PO CAPS
40.0000 mg | ORAL_CAPSULE | Freq: Every day | ORAL | Status: DC
  Administered 2019-08-22 – 2019-08-26 (×5): 40 mg via ORAL
  Filled 2019-08-22 (×7): qty 2

## 2019-08-22 MED ORDER — THIAMINE HCL 100 MG PO TABS
100.0000 mg | ORAL_TABLET | Freq: Every day | ORAL | Status: DC
  Administered 2019-08-22 – 2019-08-26 (×5): 100 mg via ORAL
  Filled 2019-08-22 (×7): qty 1

## 2019-08-22 MED ORDER — CYCLOBENZAPRINE HCL 10 MG PO TABS
10.0000 mg | ORAL_TABLET | Freq: Two times a day (BID) | ORAL | Status: DC | PRN
  Administered 2019-08-24: 10 mg via ORAL
  Filled 2019-08-22: qty 2

## 2019-08-22 MED ORDER — MELOXICAM 7.5 MG PO TABS
7.5000 mg | ORAL_TABLET | Freq: Two times a day (BID) | ORAL | Status: DC
  Administered 2019-08-22 – 2019-08-26 (×8): 7.5 mg via ORAL
  Filled 2019-08-22 (×12): qty 1

## 2019-08-22 MED ORDER — CARBAMAZEPINE ER 200 MG PO TB12
200.0000 mg | ORAL_TABLET | Freq: Every day | ORAL | Status: DC
  Administered 2019-08-22 – 2019-08-25 (×4): 200 mg via ORAL
  Filled 2019-08-22 (×6): qty 1

## 2019-08-22 MED ORDER — LORAZEPAM 1 MG PO TABS: 1.0000 mg | ORAL_TABLET | Freq: Four times a day (QID) | ORAL | Status: DC | PRN

## 2019-08-22 MED ORDER — CARBAMAZEPINE ER 100 MG PO TB12
100.0000 mg | ORAL_TABLET | Freq: Every day | ORAL | Status: DC
  Administered 2019-08-23 – 2019-08-26 (×4): 100 mg via ORAL
  Filled 2019-08-22 (×3): qty 1
  Filled 2019-08-22: qty 7
  Filled 2019-08-22 (×4): qty 1

## 2019-08-22 MED ORDER — ALBUTEROL SULFATE HFA 108 (90 BASE) MCG/ACT IN AERS: 1.0000 | INHALATION_SPRAY | Freq: Four times a day (QID) | RESPIRATORY_TRACT | Status: DC | PRN

## 2019-08-22 MED ORDER — FOLIC ACID 1 MG PO TABS
1.0000 mg | ORAL_TABLET | Freq: Every day | ORAL | Status: DC
  Administered 2019-08-22 – 2019-08-26 (×5): 1 mg via ORAL
  Filled 2019-08-22 (×7): qty 1

## 2019-08-22 MED ORDER — LEVOTHYROXINE SODIUM 100 MCG PO TABS
100.0000 ug | ORAL_TABLET | Freq: Every day | ORAL | Status: DC
  Administered 2019-08-23 – 2019-08-26 (×4): 100 ug via ORAL
  Filled 2019-08-22 (×6): qty 1

## 2019-08-22 NOTE — BHH Group Notes (Signed)
Pt did not attend wrap-up group today.
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
## 2019-08-22 NOTE — Plan of Care (Signed)
Progress note  D: pt found in bed; compliant with medication administration. Pt does complain of lightheadedness, dizziness, and presents unsteady. Pt assisted to the medication room. Pt seems sad, sullen, and reserved. Pt does have complaints of si with thoughts to cut their wrist with a  knife. "I can't do it here though". Pt agrees to approach staff before harming self. Pt denies hi/ah/vh and verbally agrees to approach staff if these become apparent or before harming themself/others while at bhh.  A: Pt provided support and encouragement. Pt given medication per protocol and standing orders. Q60m safety checks implemented and continued.  R: Pt safe on the unit. Will continue to monitor.  Pt progressing in the following metrics  Problem: Education: Goal: Ability to state activities that reduce stress will improve Outcome: Progressing   Problem: Coping: Goal: Ability to identify and develop effective coping behavior will improve Outcome: Progressing   Problem: Self-Concept: Goal: Ability to identify factors that promote anxiety will improve Outcome: Progressing Goal: Level of anxiety will decrease Outcome: Progressing
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
## 2019-08-22 NOTE — Progress Notes (Signed)
Recreation Therapy Notes  Date: 6.24.21 Time: 0945 Location: [address] Dayroom  Group Topic: Self-Esteem  Goal Area(s) Addresses:  Patient will successfully identify positive attributes about themselves.  Patient will successfully identify benefit of improved self-esteem.   Intervention: Blank crest, markers, colored pencils, music  Activity: Crest of Arms.  Patients were given a blank crest.  In that crest, they were to identify four positive things that helped to make them who they are.  Patients could identify positive qualities, accomplishments, important dates, important people, etc.  LRT also played music requested by patients as they worked.  Education:  Self-Esteem, Building control surveyor.   Education Outcome: Acknowledges education/In group clarification offered/Needs additional education  Clinical Observations/Feedback: Pt did not attend group session.    Caroll Rancher, LRT/CTRS         Caroll Rancher A 08/22/2019 10:44 AM

## 2019-08-23 LAB — PROLACTIN: Prolactin: 160 ng/mL — ABNORMAL HIGH (ref 4.8–23.3)

## 2019-08-23 MED ORDER — SULFAMETHOXAZOLE-TRIMETHOPRIM 800-160 MG PO TABS
1.0000 | ORAL_TABLET | Freq: Two times a day (BID) | ORAL | Status: DC
  Administered 2019-08-23 – 2019-08-26 (×7): 1 via ORAL
  Filled 2019-08-23 (×9): qty 1

## 2019-08-23 MED ORDER — OLANZAPINE 10 MG PO TABS
10.0000 mg | ORAL_TABLET | Freq: Every day | ORAL | Status: DC
  Administered 2019-08-23 – 2019-08-25 (×3): 10 mg via ORAL
  Filled 2019-08-23 (×5): qty 1

## 2019-08-23 NOTE — Progress Notes (Signed)
   08/23/19 2015  Psych Admission Type (Psych Patients Only)  Admission Status Voluntary  Psychosocial Assessment  Patient Complaints None  Eye Contact Brief  Facial Expression Anxious;Sullen  Affect Anxious;Depressed;Sad  Speech Logical/coherent;Soft  Interaction Assertive  Motor Activity Slow;Unsteady  Appearance/Hygiene Improved  Behavior Characteristics Appropriate to situation  Mood Pleasant  Thought Process  Coherency WDL  Content Blaming others  Delusions None reported or observed  Perception WDL  Hallucination None reported or observed  Judgment Poor  Confusion None  Danger to Self  Current suicidal ideation? Denies  Danger to Others  Danger to Others None reported or observed

## 2019-08-23 NOTE — BHH Counselor (Signed)
CSW spoke with patients mother, Jennette Dubin [phone]), regarding discharge plans for this patient. Per patients mother she has agreed for this patient to return to stay with her post discharge while she is seeking independent housing. Patient's mother reported that she has spoken to this patient on the phone and stated that the patient has been presenting better.    Ruthann Cancer MSW, [employer] Clincal Social Worker  [facility]

## 2019-08-23 NOTE — Plan of Care (Signed)
Problem: Education: Goal: Ability to state activities that reduce stress will improve Outcome: Progressing   Problem: Coping: Goal: Ability to identify and develop effective coping behavior will improve Outcome: Progressing   Problem: Self-Concept: Goal: Ability to identify factors that promote anxiety will improve Outcome: Progressing Goal: Level of anxiety will decrease Outcome: Progressing Goal: Ability to modify response to factors that promote anxiety will improve Outcome: Progressing   Problem: Education: Goal: Utilization of techniques to improve thought processes will improve Outcome: Progressing Goal: Knowledge of the prescribed therapeutic regimen will improve Outcome: Progressing   Problem: Activity: Goal: Interest or engagement in leisure activities will improve Outcome: Progressing Goal: Imbalance in normal sleep/wake cycle will improve Outcome: Progressing   Problem: Coping: Goal: Coping ability will improve Outcome: Progressing Goal: Will verbalize feelings Outcome: Progressing   Problem: Health Behavior/Discharge Planning: Goal: Ability to make decisions will improve Outcome: Progressing Goal: Compliance with therapeutic regimen will improve Outcome: Progressing   Problem: Role Relationship: Goal: Will demonstrate positive changes in social behaviors and relationships Outcome: Progressing   Problem: Safety: Goal: Ability to disclose and discuss suicidal ideas will improve Outcome: Progressing Goal: Ability to identify and utilize support systems that promote safety will improve Outcome: Progressing   Problem: Self-Concept: Goal: Will verbalize positive feelings about self Outcome: Progressing Goal: Level of anxiety will decrease Outcome: Progressing   Problem: Education: Goal: Knowledge of [facility] Education information/materials will improve Outcome: Progressing Goal: Emotional status will improve Outcome: Progressing Goal:  Mental status will improve Outcome: Progressing Goal: Verbalization of understanding the information provided will improve Outcome: Progressing   Problem: Activity: Goal: Interest or engagement in activities will improve Outcome: Progressing Goal: Sleeping patterns will improve Outcome: Progressing   Problem: Coping: Goal: Ability to verbalize frustrations and anger appropriately will improve Outcome: Progressing Goal: Ability to demonstrate self-control will improve Outcome: Progressing   Problem: Health Behavior/Discharge Planning: Goal: Identification of resources available to assist in meeting health care needs will improve Outcome: Progressing Goal: Compliance with treatment plan for underlying cause of condition will improve Outcome: Progressing   Problem: Physical Regulation: Goal: Ability to maintain clinical measurements within normal limits will improve Outcome: Progressing   Problem: Safety: Goal: Periods of time without injury will increase Outcome: Progressing   Problem: Activity: Goal: Will identify at least one activity in which they can participate Outcome: Progressing   Problem: Coping: Goal: Ability to identify and develop effective coping behavior will improve Outcome: Progressing Goal: Ability to interact with others will improve Outcome: Progressing Goal: Demonstration of participation in decision-making regarding own care will improve Outcome: Progressing Goal: Ability to use eye contact when communicating with others will improve Outcome: Progressing   Problem: Health Behavior/Discharge Planning: Goal: Identification of resources available to assist in meeting health care needs will improve Outcome: Progressing   Problem: Self-Concept: Goal: Will verbalize positive feelings about self Outcome: Progressing   Problem: Activity: Goal: Will verbalize the importance of balancing activity with adequate rest periods Outcome: Progressing    Problem: Education: Goal: Will be free of psychotic symptoms Outcome: Progressing Goal: Knowledge of the prescribed therapeutic regimen will improve Outcome: Progressing   Problem: Coping: Goal: Coping ability will improve Outcome: Progressing Goal: Will verbalize feelings Outcome: Progressing   Problem: Health Behavior/Discharge Planning: Goal: Compliance with prescribed medication regimen will improve Outcome: Progressing   Problem: Nutritional: Goal: Ability to achieve adequate nutritional intake will improve Outcome: Progressing   Problem: Role Relationship: Goal: Ability to communicate needs accurately will improve Outcome: Progressing  Goal: Ability to interact with others will improve Outcome: Progressing   Problem: Safety: Goal: Ability to redirect hostility and anger into socially appropriate behaviors will improve Outcome: Progressing Goal: Ability to remain free from injury will improve Outcome: Progressing   Problem: Self-Care: Goal: Ability to participate in self-care as condition permits will improve Outcome: Progressing   Problem: Self-Concept: Goal: Will verbalize positive feelings about self Outcome: Progressing  Patient noted to be flat with a blunted affect.  Patient denies SI, HI and AVH.  Patient had no somatic complaints.  Continue to monitor per individualized treatment plan.

## 2019-08-23 NOTE — Progress Notes (Signed)
DAR NOTE: Pt present with flat affect and depressed mood in the unit. Pt has been isolating herself and has been bed most of the time. Pt denies physical pain, took all her meds as scheduled. Pt's safety ensured with 15 minute and environmental checks. Pt currently denies SI/HI and A/V hallucinations. Pt verbally agrees to seek staff if SI/HI or A/VH occurs and to consult with staff before acting on these thoughts. Will continue POC.  
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
## 2019-08-23 NOTE — Progress Notes (Signed)
   08/23/19 0612  Vital Signs  Pulse Rate 77  Pulse Rate Source Monitor  BP (!) 130/93  BP Location Right Arm  BP Method Automatic  Patient Position (if appropriate) Standing  Oxygen Therapy  SpO2 96 %  D:  Patient out in open areas. Patient took all medicine. Patient denies SI/HI/AVH. Patient rates depression and  Anxiety 2/10. Patient stated "I don't want to kill myself, I'm depressed!" A: Support and encouragement provided Routine safety checks conducted every 15 minutes. Patient  Informed to notify staff with any concerns.  R:  Safety maintained.

## 2019-08-24 NOTE — Progress Notes (Signed)
   08/24/19 1100  Psych Admission Type (Psych Patients Only)  Admission Status Voluntary  Psychosocial Assessment  Patient Complaints None  Eye Contact Brief  Facial Expression Anxious;Sullen  Affect Anxious;Depressed;Sad  Speech Logical/coherent;Soft  Interaction Assertive  Motor Activity Slow;Unsteady  Appearance/Hygiene Improved  Behavior Characteristics Cooperative  Mood Pleasant  Thought Process  Coherency WDL  Content Blaming others  Delusions None reported or observed  Perception WDL  Hallucination None reported or observed  Judgment Poor  Confusion None  Danger to Self  Current suicidal ideation? Denies  Self-Injurious Behavior Some self-injurious ideation observed or expressed.  No lethal plan expressed   Agreement Not to Harm Self Yes  Description of Agreement verbal  Danger to Others  Danger to Others None reported or observed
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
## 2019-08-24 NOTE — Progress Notes (Signed)
Patient has been isolative to her room other than coming out for medications or snacks. She is very pleasant and appreciative of staff. She reports that she plans to stay with her mother after discharge. Support given and safety maintained with 15 min checks.

## 2019-08-24 NOTE — [facility] Group Notes (Signed)
Adult Psychoeducational Group Note  Date:  08/24/2019 Time:  11:54 AM  Group Topic/Focus:  Goals Group:   The focus of this group is to help patients establish daily goals to achieve during treatment and discuss how the patient can incorporate goal setting into their daily lives to aide in recovery.  Participation Level:  Did Not Attend    Dione Housekeeper 08/24/2019, 11:54 AM

## 2019-08-24 NOTE — [facility] Group Notes (Signed)
[facility] Group Notes: (Clinical Social Work)   08/24/2019      Type of Therapy:  Group Therapy   Participation Level:  Did Not Attend - was invited individually by Nurse/MHT and chose not to attend.   Leisa Lenz, LCSW 08/24/2019  12:16 PM

## 2019-08-24 NOTE — Progress Notes (Signed)
   08/24/19 2010  COVID-19 Daily Checkoff  Have you had a fever (temp > 37.80C/100F)  in the past 24 hours?  No  If you have had runny nose, nasal congestion, sneezing in the past 24 hours, has it worsened? No  COVID-19 EXPOSURE  Have you traveled outside the state in the past 14 days? No  Have you been in contact with someone with a confirmed diagnosis of COVID-19 or PUI in the past 14 days without wearing appropriate PPE? No  Have you been living in the same home as a person with confirmed diagnosis of COVID-19 or a PUI (household contact)? No  Have you been diagnosed with COVID-19? No

## 2019-08-25 MED ORDER — CLOTRIMAZOLE 1 % EX CREA
TOPICAL_CREAM | Freq: Two times a day (BID) | CUTANEOUS | Status: DC
  Administered 2019-08-25: 1 via TOPICAL
  Filled 2019-08-25 (×2): qty 15
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
## 2019-08-25 NOTE — Progress Notes (Signed)
   08/25/19 1100  Psych Admission Type (Psych Patients Only)  Admission Status Voluntary  Psychosocial Assessment  Patient Complaints None  Eye Contact Brief  Facial Expression Sullen;Flat  Affect Depressed;Sad  Speech Logical/coherent;Soft  Interaction Assertive  Motor Activity Slow;Unsteady  Appearance/Hygiene Improved  Behavior Characteristics Cooperative  Mood Pleasant  Thought Process  Coherency WDL  Content Blaming others  Delusions None reported or observed  Perception WDL  Hallucination None reported or observed  Judgment WDL  Confusion None  Danger to Self  Current suicidal ideation? Denies  Self-Injurious Behavior Some self-injurious ideation observed or expressed.  No lethal plan expressed   Agreement Not to Harm Self Yes  Description of Agreement verbal  Danger to Others  Danger to Others None reported or observed

## 2019-08-25 NOTE — [facility] Group Notes (Signed)
[facility] Group Notes: (Clinical Social Work)   08/25/2019      Type of Therapy:  Group Therapy   Participation Level:  Did Not Attend - was invited individually by Nurse/MHT and chose not to attend.   Leisa Lenz, LCSW 08/25/2019  12:54 PM

## 2019-08-26 LAB — CBC WITH DIFFERENTIAL/PLATELET
Abs Immature Granulocytes: 0.04 10*3/uL (ref 0.00–0.07)
Basophils Absolute: 0.1 10*3/uL (ref 0.0–0.1)
Basophils Relative: 1 %
Eosinophils Absolute: 0.1 10*3/uL (ref 0.0–0.5)
Eosinophils Relative: 1 %
HCT: 41.4 % (ref 36.0–46.0)
Hemoglobin: 12.9 g/dL (ref 12.0–15.0)
Immature Granulocytes: 0 %
Lymphocytes Relative: 16 %
Lymphs Abs: 1.5 10*3/uL (ref 0.7–4.0)
MCH: 28.5 pg (ref 26.0–34.0)
MCHC: 31.2 g/dL (ref 30.0–36.0)
MCV: 91.4 fL (ref 80.0–100.0)
Monocytes Absolute: 1.1 10*3/uL — ABNORMAL HIGH (ref 0.1–1.0)
Monocytes Relative: 12 %
Neutro Abs: 6.6 10*3/uL (ref 1.7–7.7)
Neutrophils Relative %: 70 %
Platelets: 342 10*3/uL (ref 150–400)
RBC: 4.53 MIL/uL (ref 3.87–5.11)
RDW: 15 % (ref 11.5–15.5)
WBC: 9.3 10*3/uL (ref 4.0–10.5)
nRBC: 0 % (ref 0.0–0.2)

## 2019-08-26 LAB — HEPATIC FUNCTION PANEL
ALT: 296 U/L — ABNORMAL HIGH (ref 0–44)
AST: 587 U/L — ABNORMAL HIGH (ref 15–41)
Albumin: 3.9 g/dL (ref 3.5–5.0)
Alkaline Phosphatase: 129 U/L — ABNORMAL HIGH (ref 38–126)
Bilirubin, Direct: 0.2 mg/dL (ref 0.0–0.2)
Indirect Bilirubin: 0.4 mg/dL (ref 0.3–0.9)
Total Bilirubin: 0.6 mg/dL (ref 0.3–1.2)
Total Protein: 7.4 g/dL (ref 6.5–8.1)

## 2019-08-26 LAB — CARBAMAZEPINE LEVEL, TOTAL: Carbamazepine Lvl: 4 ug/mL (ref 4.0–12.0)

## 2019-08-26 MED ORDER — SULFAMETHOXAZOLE-TRIMETHOPRIM 800-160 MG PO TABS: 1.0000 | ORAL_TABLET | Freq: Two times a day (BID) | ORAL | 0 refills | Status: DC

## 2019-08-26 MED ORDER — PANTOPRAZOLE SODIUM 40 MG PO TBEC: 40.0000 mg | DELAYED_RELEASE_TABLET | Freq: Every day | ORAL | 0 refills | Status: DC

## 2019-08-26 MED ORDER — TRAZODONE HCL 50 MG PO TABS: 50.0000 mg | ORAL_TABLET | Freq: Every evening | ORAL | 0 refills | Status: DC | PRN

## 2019-08-26 MED ORDER — OLANZAPINE 10 MG PO TABS: 10.0000 mg | ORAL_TABLET | Freq: Every day | ORAL | 0 refills | Status: DC

## 2019-08-26 MED ORDER — MELOXICAM 7.5 MG PO TABS: 7.5000 mg | ORAL_TABLET | Freq: Two times a day (BID) | ORAL | 0 refills | Status: DC

## 2019-08-26 MED ORDER — FLUOXETINE HCL 40 MG PO CAPS: 40.0000 mg | ORAL_CAPSULE | Freq: Every day | ORAL | 0 refills | Status: DC

## 2019-08-26 MED ORDER — CLOTRIMAZOLE 1 % EX CREA: TOPICAL_CREAM | Freq: Two times a day (BID) | CUTANEOUS | 0 refills | Status: AC

## 2019-08-26 MED ORDER — CARBAMAZEPINE ER 200 MG PO TB12: 200.0000 mg | ORAL_TABLET | Freq: Every day | ORAL | 0 refills | Status: DC

## 2019-08-26 MED ORDER — CARBAMAZEPINE ER 100 MG PO TB12: 100.0000 mg | ORAL_TABLET | Freq: Every day | ORAL | 0 refills | Status: DC

## 2019-08-26 MED ORDER — LEVOTHYROXINE SODIUM 100 MCG PO TABS: 100.0000 ug | ORAL_TABLET | Freq: Every day | ORAL | 0 refills | Status: DC
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
## 2019-08-26 NOTE — BHH Group Notes (Signed)
The focus of this group is to help patients establish daily goals to achieve during treatment and discuss how the patient can incorporate goal setting into their daily lives to aide in recovery.  Pt did not attend group 

## 2019-08-26 NOTE — BHH Suicide Risk Assessment (Signed)
[facility] Discharge Suicide Risk Assessment   Principal Problem: <principal problem not specified> Discharge Diagnoses: Active Problems:   Bipolar 1 disorder (HCC)   Schizophrenia (HCC)   Total Time spent with patient: 20 minutes  Musculoskeletal: Strength & Muscle Tone: within normal limits Gait & Station: normal Patient leans: N/A  Psychiatric Specialty Exam: Review of Systems  All other systems reviewed and are negative.   Blood pressure 103/65, pulse 83, temperature 98.4 F (36.9 C), temperature source Oral, resp. rate 20, height 5\' 11"  (1.803 m), weight 109.8 kg, SpO2 96 %.Body mass index is 33.75 kg/m.  General Appearance: Casual  Eye Contact::  Good  Speech:  Normal Rate409  Volume:  Normal  Mood:  Euthymic  Affect:  Congruent  Thought Process:  Coherent and Descriptions of Associations: Intact  Orientation:  Full (Time, Place, and Person)  Thought Content:  Logical  Suicidal Thoughts:  No  Homicidal Thoughts:  No  Memory:  Immediate;   Good Recent;   Good Remote;   Good  Judgement:  Intact  Insight:  Fair  Psychomotor Activity:  Normal  Concentration:  Good  Recall:  Fair  Fund of Knowledge:Fair  Language: Good  Akathisia:  Negative  Handed:  Right  AIMS (if indicated):     Assets:  Desire for Improvement Housing Resilience Social Support  Sleep:  Number of Hours: 6.75  Cognition: WNL  ADL's:  Intact   Mental Status Per Nursing Assessment::   On Admission:  Suicidal ideation indicated by patient  Demographic Factors:  Divorced or widowed, Caucasian, Low socioeconomic status and Unemployed  Loss Factors: Loss of significant relationship  Historical Factors: Impulsivity  Risk Reduction Factors:   Living with another person, especially a relative and Positive social support  Continued Clinical Symptoms:  Bipolar Disorder:   Depressive phase Alcohol/Substance Abuse/Dependencies  Cognitive Features That Contribute To Risk:  None    Suicide  Risk:  Minimal: No identifiable suicidal ideation.  Patients presenting with no risk factors but with morbid ruminations; may be classified as minimal risk based on the severity of the depressive symptoms   Follow-up Information    [facility]. Go on 08/28/2019.   Why: You have a hospital discharge appointment for therapy and medication management services on 08/28/19 at 8:30 am.  This appointment will be held in person.  Please use the side entrance of the building.  Please bring a photo ID and proof of any income. Contact information: [address] [place] [address]               Plan Of Care/Follow-up recommendations:  Activity:  ad lib  [phone], MD 08/26/2019, 8:09 AM

## 2019-08-26 NOTE — Progress Notes (Signed)
  [facility] Adult Case Management Discharge Plan :  Will you be returning to the same living situation after discharge:  No. Will be staying at mothers home. At discharge, do you have transportation home?: No. Safe Transport will be arranged.  Do you have the ability to pay for your medications: No. Samples will be provided at discharge.   Release of information consent forms completed and in the chart;  Patient's signature needed at discharge.  Patient to Follow up at:  Follow-up Information    [facility]. Go on 08/28/2019.   Why: You have a hospital discharge appointment for therapy and medication management services on 08/28/19 at 8:30 am.  This appointment will be held in person.  Please use the side entrance of the building.  Please bring a photo ID and proof of any income. Contact information: [address] [place] [address] [phone]               Next level of care provider has access to [facility] Link:no  Safety Planning and Suicide Prevention discussed: Yes,  with mother.     Has patient been referred to the Quitline?: Patient refused referral  Patient has been referred for addiction treatment: Pt. refused referral  Otelia Santee, LCSWA 08/26/2019, 9:49 AM

## 2019-08-26 NOTE — Progress Notes (Signed)
°   08/25/19 2000  Psych Admission Type (Psych Patients Only)  Admission Status Voluntary  Psychosocial Assessment  Patient Complaints None  Eye Contact Fair  Facial Expression Sullen;Fixed smile  Affect Sad  Speech Logical/coherent;Soft  Interaction Assertive  Motor Activity Slow;Unsteady  Appearance/Hygiene Unremarkable  Behavior Characteristics Cooperative  Mood Pleasant  Thought Process  Coherency WDL  Content WDL  Delusions None reported or observed  Perception WDL  Hallucination None reported or observed  Judgment WDL  Confusion None  Danger to Self  Current suicidal ideation? Denies  Self-Injurious Behavior No self-injurious ideation or behavior indicators observed or expressed   Agreement Not to Harm Self Yes  Description of Agreement verbal  Danger to Others  Danger to Others None reported or observed  Compliant with medications, Denies SI or HI. Will continue to monitor.

## 2019-08-26 NOTE — Progress Notes (Signed)
Pt discharged to lobby. Pt was stable and appreciative at that time. All papers, samples and prescriptions were given and valuables returned. Verbal understanding expressed. Denies SI/HI and A/VH. Pt given opportunity to express concerns and ask questions.  

## 2019-12-01 ENCOUNTER — Other Ambulatory Visit: Payer: Self-pay

## 2019-12-01 ENCOUNTER — Encounter (HOSPITAL_COMMUNITY): Payer: Self-pay | Admitting: *Deleted

## 2019-12-01 ENCOUNTER — Emergency Department (HOSPITAL_COMMUNITY): Payer: Medicaid Other

## 2019-12-01 ENCOUNTER — Emergency Department (HOSPITAL_COMMUNITY)
Admission: EM | Admit: 2019-12-01 | Discharge: 2019-12-01 | Disposition: A | Discharge status: Home / Self Care | Payer: Medicaid Other | Attending: Emergency Medicine | Admitting: Emergency Medicine

## 2019-12-01 DIAGNOSIS — J45909 Unspecified asthma, uncomplicated: Secondary | ICD-10-CM | POA: Diagnosis not present

## 2019-12-01 DIAGNOSIS — W228XXA Striking against or struck by other objects, initial encounter: Secondary | ICD-10-CM | POA: Insufficient documentation

## 2019-12-01 DIAGNOSIS — F17210 Nicotine dependence, cigarettes, uncomplicated: Secondary | ICD-10-CM | POA: Insufficient documentation

## 2019-12-01 DIAGNOSIS — W19XXXA Unspecified fall, initial encounter: Secondary | ICD-10-CM | POA: Diagnosis not present

## 2019-12-01 DIAGNOSIS — Z79899 Other long term (current) drug therapy: Secondary | ICD-10-CM | POA: Insufficient documentation

## 2019-12-01 DIAGNOSIS — S2231XA Fracture of one rib, right side, initial encounter for closed fracture: Secondary | ICD-10-CM | POA: Insufficient documentation

## 2019-12-01 DIAGNOSIS — S299XXA Unspecified injury of thorax, initial encounter: Secondary | ICD-10-CM | POA: Diagnosis present

## 2019-12-01 HISTORY — DX: Disorder of thyroid, unspecified: E07.9

## 2019-12-01 LAB — CBC
HCT: 39 % (ref 36.0–46.0)
Hemoglobin: 12.2 g/dL (ref 12.0–15.0)
MCH: 28.7 pg (ref 26.0–34.0)
MCHC: 31.3 g/dL (ref 30.0–36.0)
MCV: 91.8 fL (ref 80.0–100.0)
Platelets: 340 10*3/uL (ref 150–400)
RBC: 4.25 MIL/uL (ref 3.87–5.11)
RDW: 14.5 % (ref 11.5–15.5)
WBC: 12.7 10*3/uL — ABNORMAL HIGH (ref 4.0–10.5)
nRBC: 0 % (ref 0.0–0.2)

## 2019-12-01 LAB — BASIC METABOLIC PANEL
Anion gap: 9 (ref 5–15)
BUN: 14 mg/dL (ref 6–20)
CO2: 28 mmol/L (ref 22–32)
Calcium: 9.2 mg/dL (ref 8.9–10.3)
Chloride: 100 mmol/L (ref 98–111)
Creatinine, Ser: 0.73 mg/dL (ref 0.44–1.00)
GFR calc Af Amer: >60 mL/min (ref >60)
GFR calc non Af Amer: >60 mL/min (ref >60)
Glucose, Bld: 106 mg/dL — ABNORMAL HIGH (ref 70–99)
Potassium: 3.4 mmol/L — ABNORMAL LOW (ref 3.5–5.1)
Sodium: 137 mmol/L (ref 135–145)

## 2019-12-01 LAB — TROPONIN I (HIGH SENSITIVITY)
Troponin I (High Sensitivity): 3 ng/L (ref <18)
Troponin I (High Sensitivity): 3 ng/L (ref <18)

## 2019-12-01 LAB — I-STAT BETA HCG BLOOD, ED (MC, WL, AP ONLY): I-stat hCG, quantitative: <5 m[IU]/mL (ref <5)

## 2019-12-01 MED ORDER — KETOROLAC TROMETHAMINE 30 MG/ML IJ SOLN
15.0000 mg | Freq: Once | INTRAMUSCULAR | Status: AC
  Administered 2019-12-01: 15 mg via INTRAMUSCULAR
  Filled 2019-12-01: qty 1

## 2019-12-01 MED ORDER — ALBUTEROL SULFATE HFA 108 (90 BASE) MCG/ACT IN AERS
2.0000 | INHALATION_SPRAY | Freq: Once | RESPIRATORY_TRACT | Status: AC
  Administered 2019-12-01: 2 via RESPIRATORY_TRACT
  Filled 2019-12-01: qty 6.7
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
## 2019-12-01 NOTE — Discharge Instructions (Signed)
You have been diagnosed with a rib fracture.  Typically these improved with time, rest, and anti-inflammatories. Please use ibuprofen, 400 mg, 3 times daily as needed for pain control. Return here for concerning changes in your condition

## 2019-12-01 NOTE — ED Provider Notes (Signed)
[facility] EMERGENCY DEPARTMENT Provider Note   CSN: [id number] Arrival date & time: 12/01/19  0114     History Chief Complaint  Patient presents with   Fall    Gabrielle Carter is a 44 y.o. female.  HPI  Patient presents with concern pain across the anterior chest and dyspnea. Patient is a history of smoking, and he is currently under arrest per Today, she was brought here from jail for clearance after she complained of chest pain, difficulty breathing. No fever. Patient notes that she fell 3 nights ago, striking her superior thorax against a fence. Since that time she has had pain persistently, worse with inspiration.  No medication for relief.  No fever, no nausea, no vomiting, no other complaints.   Past Medical History:  Diagnosis Date   Asthma    Thyroid disease     Patient Active Problem List   Diagnosis Date Noted   Bipolar 1 disorder (HCC) 08/21/2019   Schizophrenia (HCC) 08/21/2019   Wheezing    Schizoaffective disorder (HCC) 06/04/2018    History reviewed. No pertinent surgical history.   OB History   No obstetric history on file.     No family history on file.  Social History   Tobacco Use   Smoking status: Current Every Day Smoker    Packs/day: 1.50    Types: Cigarettes   Smokeless tobacco: Never Used  Vaping Use   Vaping Use: Never used  Substance Use Topics   Alcohol use: Yes    Alcohol/week: 2.0 standard drinks    Types: 2 Standard drinks or equivalent per week    Comment: monthly or less   Drug use: Yes    Types: Cocaine, Marijuana    Home Medications Prior to Admission medications   Medication Sig Start Date End Date Taking? Authorizing Provider  albuterol (PROVENTIL HFA;VENTOLIN HFA) 108 (90 Base) MCG/ACT inhaler Inhale 2 puffs into the lungs every 6 (six) hours as needed for wheezing or shortness of breath. 06/08/18  Yes Aldean Baker, NP  carbamazepine (TEGRETOL XR) 100 MG 12 hr tablet Take 1  tablet (100 mg total) by mouth daily. 08/27/19  Yes Aldean Baker, NP  carbamazepine (TEGRETOL XR) 200 MG 12 hr tablet Take 1 tablet (200 mg total) by mouth at bedtime. 08/26/19  Yes Aldean Baker, NP  FLUoxetine (PROZAC) 40 MG capsule Take 1 capsule (40 mg total) by mouth daily. 08/27/19  Yes Aldean Baker, NP  levothyroxine (SYNTHROID) 100 MCG tablet Take 1 tablet (100 mcg total) by mouth daily at 6 (six) AM. 08/27/19  Yes Aldean Baker, NP  meloxicam (MOBIC) 7.5 MG tablet Take 1 tablet (7.5 mg total) by mouth 2 (two) times daily. 08/26/19  Yes Aldean Baker, NP  pantoprazole (PROTONIX) 40 MG tablet Take 1 tablet (40 mg total) by mouth daily. 08/27/19  Yes Aldean Baker, NP  traZODone (DESYREL) 50 MG tablet Take 1 tablet (50 mg total) by mouth at bedtime as needed for sleep. Patient taking differently: Take 50 mg by mouth at bedtime.  08/26/19  Yes Aldean Baker, NP  clotrimazole (LOTRIMIN) 1 % cream Apply topically 2 (two) times daily. Patient not taking: Reported on 12/01/2019 08/26/19   Aldean Baker, NP  OLANZapine (ZYPREXA) 10 MG tablet Take 1 tablet (10 mg total) by mouth at bedtime. Patient not taking: Reported on 12/01/2019 08/26/19   Aldean Baker, NP    Allergies    Bee venom  Review of Systems   Review of Systems  Constitutional:       Per HPI, otherwise negative  HENT:       Per HPI, otherwise negative  Respiratory:       Per HPI, otherwise negative  Cardiovascular:       Per HPI, otherwise negative  Gastrointestinal: Negative for vomiting.  Endocrine:       Negative aside from HPI  Genitourinary:       Neg aside from HPI   Musculoskeletal:       Per HPI, otherwise negative  Skin: Positive for color change.  Neurological: Negative for syncope.    Physical Exam Updated Vital Signs BP 121/78 (BP Location: Left Arm)    Pulse 67    Temp 98.3 F (36.8 C) (Oral)    Resp 16    SpO2 92%   Physical Exam Vitals and nursing note reviewed.  Constitutional:      General:  She is not in acute distress.    Appearance: She is well-developed.  HENT:     Head: Normocephalic and atraumatic.  Eyes:     Conjunctiva/sclera: Conjunctivae normal.  Cardiovascular:     Rate and Rhythm: Normal rate and regular rhythm.  Pulmonary:     Effort: Pulmonary effort is normal. No respiratory distress.     Breath sounds: Normal breath sounds. No stridor.  Chest:    Abdominal:     General: There is no distension.  Skin:    General: Skin is warm and dry.       Neurological:     Mental Status: She is alert and oriented to person, place, and time.     Cranial Nerves: No cranial nerve deficit.      ED Results / Procedures / Treatments   Labs (all labs ordered are listed, but only abnormal results are displayed) Labs Reviewed  BASIC METABOLIC PANEL - Abnormal; Notable for the following components:      Result Value   Potassium 3.4 (*)    Glucose, Bld 106 (*)    All other components within normal limits  CBC - Abnormal; Notable for the following components:   WBC 12.7 (*)    All other components within normal limits  I-STAT BETA HCG BLOOD, ED ([facility], [facility], [facility] ONLY)  TROPONIN I (HIGH SENSITIVITY)  TROPONIN I (HIGH SENSITIVITY)    EKG EKG Interpretation  Date/Time:  Sunday December 01 2019 01:30:48 EDT Ventricular Rate:  65 PR Interval:  134 QRS Duration: 104 QT Interval:  418 QTC Calculation: 434 R Axis:   91 Text Interpretation: Normal sinus rhythm with sinus arrhythmia Rightward axis Incomplete right bundle branch block Septal infarct , age undetermined Abnormal ECG Confirmed by Gerhard Munch [phone]) on 12/01/2019 9:51:26 AM   Radiology DG Chest 2 View  Result Date: 12/01/2019 CLINICAL DATA:  Chest pain EXAM: CHEST - 2 VIEW COMPARISON:  None. FINDINGS: The heart size and mediastinal contours are within normal limits. Both lungs are clear. The visualized skeletal structures are unremarkable. IMPRESSION: No active cardiopulmonary disease. Electronically Signed    By: Jonna Clark M.D.   On: 12/01/2019 02:23   CT Chest Wo Contrast  Result Date: 12/01/2019 CLINICAL DATA:  Rib fracture suspected.  Chest trauma. EXAM: CT CHEST WITHOUT CONTRAST TECHNIQUE: Multidetector CT imaging of the chest was performed following the standard protocol without IV contrast. COMPARISON:  Chest radiographs from the same day. FINDINGS: Cardiovascular: No significant vascular findings. Normal heart size. Small pericardial effusion. Mild calcified  coronary artery atherosclerosis Mediastinum/Nodes: No enlarged mediastinal or axillary lymph nodes. Thyroid gland, trachea, and esophagus demonstrate no significant findings.1 Lungs/Pleura: Linear opacities in the left lung base, compatible with atelectasis. Otherwise, no consolidation. No pneumothorax. No pleural effusions. Upper Abdomen: No acute abnormality. Musculoskeletal: Acute or subacute minimally displaced right anterior second rib fracture. IMPRESSION: 1. Acute or subacute minimally displaced right anterior second rib fracture. No pneumothorax. 2. Left basilar atelectasis. Electronically Signed   By: Feliberto Harts MD   On: 12/01/2019 10:44    Procedures Procedures (including critical care time)  Medications Ordered in ED Medications  albuterol (VENTOLIN HFA) 108 (90 Base) MCG/ACT inhaler 2 puff (2 puffs Inhalation Given 12/01/19 1037)  ketorolac (TORADOL) 30 MG/ML injection 15 mg (15 mg Intramuscular Given 12/01/19 1037)    ED Course  I have reviewed the triage vital signs and the nursing notes.  Pertinent labs & imaging results that were available during my care of the patient were reviewed by me and considered in my medical decision making (see chart for details).     X-ray performed after initial evaluation unremarkable, but with concern for thoracic injury, CT scan was ordered.  11:00 AM CT scan demonstrates right second rib fracture.  No pneumothorax.  This is likely contributing to patient's pain, dyspnea,  superficial changes. With otherwise reassuring evaluation including labs, EKG, no evidence for atypical ACS or other complication, the patient is appropriate for discharge with ongoing anti-inflammatories for her rib fracture, dyspnea.   MDM Rules/Calculators/A&P MDM Number of Diagnoses or Management Options   Amount and/or Complexity of Data Reviewed Clinical lab tests: reviewed Tests in the radiology section of CPT: reviewed Tests in the medicine section of CPT: reviewed Obtain history from someone other than the patient: yes Independent visualization of images, tracings, or specimens: yes  Risk of Complications, Morbidity, and/or Mortality Presenting problems: high Diagnostic procedures: high Management options: high  Critical Care Total time providing critical care: < 30 minutes  Patient Progress Patient progress: stable  Final Clinical Impression(s) / ED Diagnoses Final diagnoses:  Fall, initial encounter  Closed fracture of one rib of right side, initial encounter     Gerhard Munch, MD 12/01/19 1103

## 2020-12-20 DIAGNOSIS — I3139 Other pericardial effusion (noninflammatory): Secondary | ICD-10-CM

## 2021-05-12 ENCOUNTER — Inpatient Hospital Stay (HOSPITAL_COMMUNITY): Payer: Medicaid Other

## 2021-05-12 ENCOUNTER — Inpatient Hospital Stay (HOSPITAL_COMMUNITY)
Admission: AD | Admit: 2021-05-12 | Discharge: 2021-05-22 | DRG: 871 | Disposition: A | Discharge status: Home / Self Care | Payer: Medicaid Other | Source: Other Acute Inpatient Hospital | Attending: Internal Medicine | Admitting: Internal Medicine

## 2021-05-12 DIAGNOSIS — J440 Chronic obstructive pulmonary disease with acute lower respiratory infection: Secondary | ICD-10-CM | POA: Diagnosis present

## 2021-05-12 DIAGNOSIS — Z6841 Body Mass Index (BMI) 40.0 and over, adult: Secondary | ICD-10-CM

## 2021-05-12 DIAGNOSIS — R6520 Severe sepsis without septic shock: Secondary | ICD-10-CM | POA: Diagnosis present

## 2021-05-12 DIAGNOSIS — R062 Wheezing: Secondary | ICD-10-CM | POA: Diagnosis present

## 2021-05-12 DIAGNOSIS — Z9114 Patient's other noncompliance with medication regimen: Secondary | ICD-10-CM | POA: Diagnosis not present

## 2021-05-12 DIAGNOSIS — A419 Sepsis, unspecified organism: Principal | ICD-10-CM | POA: Diagnosis present

## 2021-05-12 DIAGNOSIS — R531 Weakness: Secondary | ICD-10-CM

## 2021-05-12 DIAGNOSIS — I3139 Other pericardial effusion (noninflammatory): Secondary | ICD-10-CM | POA: Diagnosis present

## 2021-05-12 DIAGNOSIS — E876 Hypokalemia: Secondary | ICD-10-CM | POA: Diagnosis present

## 2021-05-12 DIAGNOSIS — G9341 Metabolic encephalopathy: Secondary | ICD-10-CM | POA: Diagnosis present

## 2021-05-12 DIAGNOSIS — F200 Paranoid schizophrenia: Secondary | ICD-10-CM | POA: Diagnosis not present

## 2021-05-12 DIAGNOSIS — J189 Pneumonia, unspecified organism: Secondary | ICD-10-CM | POA: Diagnosis not present

## 2021-05-12 DIAGNOSIS — J9602 Acute respiratory failure with hypercapnia: Secondary | ICD-10-CM | POA: Diagnosis not present

## 2021-05-12 DIAGNOSIS — E871 Hypo-osmolality and hyponatremia: Secondary | ICD-10-CM | POA: Diagnosis present

## 2021-05-12 DIAGNOSIS — Z79890 Hormone replacement therapy (postmenopausal): Secondary | ICD-10-CM

## 2021-05-12 DIAGNOSIS — N179 Acute kidney failure, unspecified: Secondary | ICD-10-CM | POA: Diagnosis present

## 2021-05-12 DIAGNOSIS — Z818 Family history of other mental and behavioral disorders: Secondary | ICD-10-CM

## 2021-05-12 DIAGNOSIS — K7200 Acute and subacute hepatic failure without coma: Secondary | ICD-10-CM | POA: Diagnosis not present

## 2021-05-12 DIAGNOSIS — T380X5A Adverse effect of glucocorticoids and synthetic analogues, initial encounter: Secondary | ICD-10-CM | POA: Diagnosis present

## 2021-05-12 DIAGNOSIS — F4310 Post-traumatic stress disorder, unspecified: Secondary | ICD-10-CM | POA: Diagnosis present

## 2021-05-12 DIAGNOSIS — I5033 Acute on chronic diastolic (congestive) heart failure: Secondary | ICD-10-CM | POA: Diagnosis not present

## 2021-05-12 DIAGNOSIS — Z9289 Personal history of other medical treatment: Secondary | ICD-10-CM

## 2021-05-12 DIAGNOSIS — E035 Myxedema coma: Secondary | ICD-10-CM

## 2021-05-12 DIAGNOSIS — K760 Fatty (change of) liver, not elsewhere classified: Secondary | ICD-10-CM | POA: Diagnosis present

## 2021-05-12 DIAGNOSIS — Z23 Encounter for immunization: Secondary | ICD-10-CM | POA: Diagnosis not present

## 2021-05-12 DIAGNOSIS — Z91199 Patient's noncompliance with other medical treatment and regimen due to unspecified reason: Secondary | ICD-10-CM

## 2021-05-12 DIAGNOSIS — G4733 Obstructive sleep apnea (adult) (pediatric): Secondary | ICD-10-CM | POA: Diagnosis present

## 2021-05-12 DIAGNOSIS — R0602 Shortness of breath: Secondary | ICD-10-CM | POA: Diagnosis present

## 2021-05-12 DIAGNOSIS — J13 Pneumonia due to Streptococcus pneumoniae: Secondary | ICD-10-CM | POA: Diagnosis present

## 2021-05-12 DIAGNOSIS — F209 Schizophrenia, unspecified: Secondary | ICD-10-CM | POA: Diagnosis present

## 2021-05-12 DIAGNOSIS — E873 Alkalosis: Secondary | ICD-10-CM | POA: Diagnosis present

## 2021-05-12 DIAGNOSIS — Z62810 Personal history of physical and sexual abuse in childhood: Secondary | ICD-10-CM | POA: Diagnosis present

## 2021-05-12 DIAGNOSIS — I5030 Unspecified diastolic (congestive) heart failure: Secondary | ICD-10-CM

## 2021-05-12 DIAGNOSIS — E1165 Type 2 diabetes mellitus with hyperglycemia: Secondary | ICD-10-CM | POA: Diagnosis not present

## 2021-05-12 DIAGNOSIS — E669 Obesity, unspecified: Secondary | ICD-10-CM

## 2021-05-12 DIAGNOSIS — F17210 Nicotine dependence, cigarettes, uncomplicated: Secondary | ICD-10-CM | POA: Diagnosis present

## 2021-05-12 DIAGNOSIS — Z01818 Encounter for other preprocedural examination: Secondary | ICD-10-CM

## 2021-05-12 DIAGNOSIS — T501X5A Adverse effect of loop [high-ceiling] diuretics, initial encounter: Secondary | ICD-10-CM | POA: Diagnosis not present

## 2021-05-12 DIAGNOSIS — J9601 Acute respiratory failure with hypoxia: Secondary | ICD-10-CM | POA: Diagnosis present

## 2021-05-12 DIAGNOSIS — E119 Type 2 diabetes mellitus without complications: Secondary | ICD-10-CM

## 2021-05-12 LAB — URINALYSIS, ROUTINE W REFLEX MICROSCOPIC
Bilirubin Urine: NEGATIVE
Glucose, UA: NEGATIVE mg/dL
Hgb urine dipstick: NEGATIVE
Ketones, ur: NEGATIVE mg/dL
Nitrite: NEGATIVE
Protein, ur: NEGATIVE mg/dL
Specific Gravity, Urine: 1.009 (ref 1.005–1.030)
pH: 5 (ref 5.0–8.0)

## 2021-05-12 LAB — ECHOCARDIOGRAM COMPLETE
AR max vel: 2.9 cm2
AV Peak grad: 9.4 mmHg
Ao pk vel: 1.53 m/s
Area-P 1/2: 3.53 cm2
Height: 68 in
S' Lateral: 3.89 cm
Single Plane A4C EF: 45.4 %
Weight: 4804.26 oz

## 2021-05-12 LAB — SODIUM, URINE, RANDOM: Sodium, Ur: 15 mmol/L

## 2021-05-12 LAB — POCT I-STAT 7, (LYTES, BLD GAS, ICA,H+H)
Acid-Base Excess: 7 mmol/L — ABNORMAL HIGH (ref 0.0–2.0)
Bicarbonate: 32 mmol/L — ABNORMAL HIGH (ref 20.0–28.0)
Calcium, Ion: 1.11 mmol/L — ABNORMAL LOW (ref 1.15–1.40)
HCT: 46 % (ref 36.0–46.0)
Hemoglobin: 15.6 g/dL — ABNORMAL HIGH (ref 12.0–15.0)
O2 Saturation: 97 %
Patient temperature: 100.2
Potassium: 4 mmol/L (ref 3.5–5.1)
Sodium: 133 mmol/L — ABNORMAL LOW (ref 135–145)
TCO2: 33 mmol/L — ABNORMAL HIGH (ref 22–32)
pCO2 arterial: 46 mmHg (ref 32–48)
pH, Arterial: 7.454 — ABNORMAL HIGH (ref 7.35–7.45)
pO2, Arterial: 90 mmHg (ref 83–108)

## 2021-05-12 LAB — COMPREHENSIVE METABOLIC PANEL
ALT: 734 U/L — ABNORMAL HIGH (ref 0–44)
AST: 1327 U/L — ABNORMAL HIGH (ref 15–41)
Albumin: 3.3 g/dL — ABNORMAL LOW (ref 3.5–5.0)
Alkaline Phosphatase: 73 U/L (ref 38–126)
Anion gap: 12 (ref 5–15)
BUN: 35 mg/dL — ABNORMAL HIGH (ref 6–20)
CO2: 31 mmol/L (ref 22–32)
Calcium: 8.2 mg/dL — ABNORMAL LOW (ref 8.9–10.3)
Chloride: 88 mmol/L — ABNORMAL LOW (ref 98–111)
Creatinine, Ser: 1.86 mg/dL — ABNORMAL HIGH (ref 0.44–1.00)
GFR, Estimated: 33 mL/min — ABNORMAL LOW (ref >60)
Glucose, Bld: 135 mg/dL — ABNORMAL HIGH (ref 70–99)
Potassium: 3.9 mmol/L (ref 3.5–5.1)
Sodium: 131 mmol/L — ABNORMAL LOW (ref 135–145)
Total Bilirubin: 0.7 mg/dL (ref 0.3–1.2)
Total Protein: 6.3 g/dL — ABNORMAL LOW (ref 6.5–8.1)

## 2021-05-12 LAB — CORTISOL: Cortisol, Plasma: 14.7 ug/dL

## 2021-05-12 LAB — PHOSPHORUS
Phosphorus: 2.5 mg/dL (ref 2.5–4.6)
Phosphorus: 4 mg/dL (ref 2.5–4.6)

## 2021-05-12 LAB — CBC
HCT: 39.4 % (ref 36.0–46.0)
Hemoglobin: 12.5 g/dL (ref 12.0–15.0)
MCH: 26 pg (ref 26.0–34.0)
MCHC: 31.7 g/dL (ref 30.0–36.0)
MCV: 82.1 fL (ref 80.0–100.0)
Platelets: 247 10*3/uL (ref 150–400)
RBC: 4.8 MIL/uL (ref 3.87–5.11)
RDW: 16.3 % — ABNORMAL HIGH (ref 11.5–15.5)
WBC: 15.1 10*3/uL — ABNORMAL HIGH (ref 4.0–10.5)
nRBC: 0.3 % — ABNORMAL HIGH (ref 0.0–0.2)

## 2021-05-12 LAB — GLUCOSE, CAPILLARY
Glucose-Capillary: 177 mg/dL — ABNORMAL HIGH (ref 70–99)
Glucose-Capillary: 124 mg/dL — ABNORMAL HIGH (ref 70–99)
Glucose-Capillary: 113 mg/dL — ABNORMAL HIGH (ref 70–99)
Glucose-Capillary: 126 mg/dL — ABNORMAL HIGH (ref 70–99)
Glucose-Capillary: 172 mg/dL — ABNORMAL HIGH (ref 70–99)
Glucose-Capillary: 182 mg/dL — ABNORMAL HIGH (ref 70–99)

## 2021-05-12 LAB — MRSA NEXT GEN BY PCR, NASAL: MRSA by PCR Next Gen: NOT DETECTED

## 2021-05-12 LAB — PREGNANCY, URINE: Preg Test, Ur: NEGATIVE

## 2021-05-12 LAB — MAGNESIUM
Magnesium: 2.1 mg/dL (ref 1.7–2.4)
Magnesium: 2.3 mg/dL (ref 1.7–2.4)

## 2021-05-12 LAB — T4, FREE: Free T4: <0.25 ng/dL — ABNORMAL LOW (ref 0.61–1.12)

## 2021-05-12 LAB — HIV ANTIBODY (ROUTINE TESTING W REFLEX): HIV Screen 4th Generation wRfx: NONREACTIVE

## 2021-05-12 LAB — TSH: TSH: 95.921 u[IU]/mL — ABNORMAL HIGH (ref 0.350–4.500)

## 2021-05-12 MED ORDER — DEXTROSE 5 % IV SOLN
250.0000 mg | INTRAVENOUS | Status: AC
  Administered 2021-05-12: 250 mg via INTRAVENOUS
  Filled 2021-05-12: qty 2.5

## 2021-05-12 MED ORDER — LIOTHYRONINE SODIUM 5 MCG PO TABS: 5.0000 ug | ORAL_TABLET | Freq: Every day | ORAL | Status: DC

## 2021-05-12 MED ORDER — HEPARIN SODIUM (PORCINE) 5000 UNIT/ML IJ SOLN
5000.0000 [IU] | Freq: Three times a day (TID) | INTRAMUSCULAR | Status: DC
  Administered 2021-05-12 – 2021-05-22 (×31): 5000 [IU] via SUBCUTANEOUS
  Filled 2021-05-12 (×31): qty 1

## 2021-05-12 MED ORDER — FLUOXETINE HCL 20 MG PO CAPS
40.0000 mg | ORAL_CAPSULE | Freq: Every day | ORAL | Status: DC
  Administered 2021-05-12: 40 mg
  Filled 2021-05-12 (×2): qty 2

## 2021-05-12 MED ORDER — PROPOFOL 1000 MG/100ML IV EMUL
INTRAVENOUS | Status: AC
  Administered 2021-05-12: 40 ug/kg/min via INTRAVENOUS
  Filled 2021-05-12: qty 100

## 2021-05-12 MED ORDER — SODIUM CHLORIDE 0.9% FLUSH: 10.0000 mL | INTRAVENOUS | Status: DC | PRN

## 2021-05-12 MED ORDER — HYDROCORTISONE SOD SUC (PF) 100 MG IJ SOLR
100.0000 mg | Freq: Three times a day (TID) | INTRAMUSCULAR | Status: DC
  Administered 2021-05-12 – 2021-05-17 (×16): 100 mg via INTRAVENOUS
  Filled 2021-05-12 (×16): qty 2

## 2021-05-12 MED ORDER — PROPOFOL 1000 MG/100ML IV EMUL
0.0000 ug/kg/min | INTRAVENOUS | Status: DC
  Administered 2021-05-12: 30 ug/kg/min via INTRAVENOUS
  Administered 2021-05-12: 15 ug/kg/min via INTRAVENOUS
  Administered 2021-05-12: 20 ug/kg/min via INTRAVENOUS
  Administered 2021-05-12: 10 ug/kg/min via INTRAVENOUS
  Filled 2021-05-12 (×5): qty 100

## 2021-05-12 MED ORDER — ALBUTEROL SULFATE (2.5 MG/3ML) 0.083% IN NEBU
2.5000 mg | INHALATION_SOLUTION | RESPIRATORY_TRACT | Status: DC | PRN
  Filled 2021-05-12: qty 3

## 2021-05-12 MED ORDER — POTASSIUM & SODIUM PHOSPHATES 280-160-250 MG PO PACK
1.0000 | PACK | Freq: Three times a day (TID) | ORAL | Status: AC
  Administered 2021-05-12 (×3): 1
  Filled 2021-05-12 (×3): qty 1

## 2021-05-12 MED ORDER — CHLORHEXIDINE GLUCONATE 0.12% ORAL RINSE (MEDLINE KIT)
15.0000 mL | Freq: Two times a day (BID) | OROMUCOSAL | Status: DC
  Administered 2021-05-12 – 2021-05-13 (×3): 15 mL via OROMUCOSAL

## 2021-05-12 MED ORDER — PROSOURCE TF PO LIQD
45.0000 mL | Freq: Three times a day (TID) | ORAL | Status: DC
  Administered 2021-05-12 – 2021-05-13 (×3): 45 mL
  Filled 2021-05-12 (×3): qty 45

## 2021-05-12 MED ORDER — FENTANYL CITRATE (PF) 100 MCG/2ML IJ SOLN
50.0000 ug | INTRAMUSCULAR | Status: DC | PRN
  Administered 2021-05-12 – 2021-05-13 (×9): 100 ug via INTRAVENOUS
  Filled 2021-05-12 (×11): qty 2

## 2021-05-12 MED ORDER — SODIUM CHLORIDE 0.9 % IV SOLN: INTRAVENOUS | Status: DC

## 2021-05-12 MED ORDER — ORAL CARE MOUTH RINSE
15.0000 mL | OROMUCOSAL | Status: DC
  Administered 2021-05-12 – 2021-05-13 (×15): 15 mL via OROMUCOSAL

## 2021-05-12 MED ORDER — COSYNTROPIN 0.25 MG IJ SOLR
0.2500 mg | Freq: Once | INTRAMUSCULAR | Status: AC
  Administered 2021-05-13: 0.25 mg via INTRAVENOUS
  Filled 2021-05-12 (×2): qty 0.25

## 2021-05-12 MED ORDER — LIOTHYRONINE SODIUM 5 MCG PO TABS
5.0000 ug | ORAL_TABLET | Freq: Three times a day (TID) | ORAL | Status: DC
  Administered 2021-05-12 – 2021-05-14 (×7): 5 ug
  Filled 2021-05-12 (×8): qty 1

## 2021-05-12 MED ORDER — FENTANYL CITRATE (PF) 100 MCG/2ML IJ SOLN: 50.0000 ug | INTRAMUSCULAR | Status: DC | PRN

## 2021-05-12 MED ORDER — CHLORHEXIDINE GLUCONATE CLOTH 2 % EX PADS
6.0000 | MEDICATED_PAD | Freq: Every day | CUTANEOUS | Status: DC
  Administered 2021-05-13 – 2021-05-17 (×6): 6 via TOPICAL

## 2021-05-12 MED ORDER — DOCUSATE SODIUM 50 MG/5ML PO LIQD: 100.0000 mg | Freq: Two times a day (BID) | ORAL | Status: DC | PRN

## 2021-05-12 MED ORDER — MIDAZOLAM HCL 2 MG/2ML IJ SOLN: 0.5000 mg | INTRAMUSCULAR | Status: DC | PRN

## 2021-05-12 MED ORDER — LEVOTHYROXINE SODIUM 100 MCG/5ML IV SOLN
100.0000 ug | Freq: Every day | INTRAVENOUS | Status: DC
  Administered 2021-05-12 – 2021-05-13 (×2): 100 ug via INTRAVENOUS
  Filled 2021-05-12 (×3): qty 5

## 2021-05-12 MED ORDER — LEVOTHYROXINE SODIUM 100 MCG/5ML IV SOLN
200.0000 ug | Freq: Once | INTRAVENOUS | Status: AC
  Administered 2021-05-12: 200 ug via INTRAVENOUS
  Filled 2021-05-12: qty 10

## 2021-05-12 MED ORDER — SODIUM CHLORIDE 0.9% FLUSH
10.0000 mL | Freq: Two times a day (BID) | INTRAVENOUS | Status: DC
  Administered 2021-05-12 – 2021-05-17 (×11): 10 mL

## 2021-05-12 MED ORDER — VITAL 1.5 CAL PO LIQD
1000.0000 mL | ORAL | Status: DC
  Administered 2021-05-12: 1000 mL
  Filled 2021-05-12: qty 1000

## 2021-05-12 MED ORDER — FLUOXETINE HCL 40 MG PO CAPS: 40.0000 mg | ORAL_CAPSULE | Freq: Every day | ORAL | Status: DC

## 2021-05-12 MED ORDER — MIDAZOLAM-SODIUM CHLORIDE 100-0.9 MG/100ML-% IV SOLN
INTRAVENOUS | Status: AC
  Filled 2021-05-12: qty 100

## 2021-05-12 MED ORDER — POLYETHYLENE GLYCOL 3350 17 G PO PACK
17.0000 g | PACK | Freq: Every day | ORAL | Status: DC | PRN
  Administered 2021-05-17: 17 g
  Filled 2021-05-12: qty 1

## 2021-05-12 MED ORDER — FUROSEMIDE 10 MG/ML IJ SOLN
40.0000 mg | Freq: Once | INTRAMUSCULAR | Status: AC
  Administered 2021-05-12: 40 mg via INTRAVENOUS
  Filled 2021-05-12: qty 4

## 2021-05-12 MED ORDER — SODIUM CHLORIDE 0.9 % IV SOLN
1.0000 g | INTRAVENOUS | Status: AC
  Administered 2021-05-12: 1 g via INTRAVENOUS
  Filled 2021-05-12: qty 10

## 2021-05-12 MED ORDER — FAMOTIDINE 20 MG PO TABS
20.0000 mg | ORAL_TABLET | Freq: Two times a day (BID) | ORAL | Status: DC
  Administered 2021-05-12 – 2021-05-13 (×4): 20 mg
  Filled 2021-05-12 (×4): qty 1

## 2021-05-12 MED ORDER — LEVOTHYROXINE SODIUM 100 MCG/5ML IV SOLN: 50.0000 ug | Freq: Every day | INTRAVENOUS | Status: DC

## 2021-05-12 NOTE — Progress Notes (Signed)
eLink Physician-Brief Progress Note ?Patient Name: Gabrielle Carter ?DOB: [birth year] ?MRN: [id number] ? ? ?Date of Service ? 05/12/2021  ?HPI/Events of Note ? Patient transferred from [facility] with acute hypercapnic respiratory failure s/p intubation and mechanical ventilation.  ?eICU Interventions ? New Patient Evaluation.  ? ? ? ?  ? ?Frederik Pear ?05/12/2021, 2:00 AM ?

## 2021-05-12 NOTE — Progress Notes (Signed)
Initial Nutrition Assessment ? ?DOCUMENTATION CODES:  ? ?Not applicable ? ?INTERVENTION:  ? ?Initiate tube feeds via OG tube: ?- Vital 1.5 @ 55 ml/hr (1320 ml/day) ?- ProSource TF 45 ml TID ? ?Tube feeding regimen provides 2100 kcal, 122 grams of protein, and 1008 ml of H2O.  ? ?NUTRITION DIAGNOSIS:  ? ?Inadequate oral intake related to inability to eat as evidenced by NPO status. ? ?GOAL:  ? ?Patient will meet greater than or equal to 90% of their needs ? ?MONITOR:  ? ?Vent status, Labs, Weight trends, TF tolerance, I & O's ? ?REASON FOR ASSESSMENT:  ? ?Ventilator, Consult ?Enteral/tube feeding initiation and management ? ?ASSESSMENT:  ? ?46 year old female who presented from [facility] on 3/15 with respiratory failure. PMH of bipolar disorder, schizophrenia, PTSD, hypothyroidism, COPD, tobacco use, CHF. Pt was intubated on 3/14. Pt admitted with myxedema coma, acute hypoxemic and hypercapnic respiratory failure, severe sepsis, AKI. ? ?Discussed pt with RN and during ICU rounds. ? ?Unable to obtain diet and weight history at this time. No weight history available in chart. ? ?Consult received for tube feeding initiation and management. Pt with OG tube and side port overlying the stomach. Abdomen mildly distended at time of visit. ? ?Patient is currently intubated on ventilator support ?MV: 7.1 L/min ?Temp (24hrs), Avg:98.7 ?F (37.1 ?C), Min:97.5 ?F (36.4 ?C), Max:100.2 ?F (37.9 ?C) ? ?Drips: ?Propofol: 16.34 ml/hr (provides 431 kcal daily from lipid) ? ?Medications reviewed and include: pepcid, IV lasix 40 mg x 1, IV solu-cortef, phos-nak 1 packet x 3, IV abx ? ?Labs reviewed: sodium 131, BUN 35, creatinine 1.86, ionized calcium 1.11, elevated LFTs, WBC 15.1 ?CBG's: 124-177 ? ?NUTRITION - FOCUSED PHYSICAL EXAM: ? ?Flowsheet Row Most Recent Value  ?Orbital Region No depletion  ?Upper Arm Region No depletion  ?Thoracic and Lumbar Region No depletion  ?Buccal Region Unable to assess  ?Temple Region No depletion   ?Clavicle Bone Region No depletion  ?Clavicle and Acromion Bone Region No depletion  ?Scapular Bone Region Unable to assess  ?Dorsal Hand No depletion  ?Patellar Region No depletion  ?Anterior Thigh Region No depletion  ?Posterior Calf Region No depletion  ?Edema (RD Assessment) Mild  [generalized]  ?Hair Reviewed  ?Eyes Unable to assess  ?Mouth Unable to assess  ?Skin Reviewed  ?Nails Reviewed  ? ?  ? ? ?Diet Order:   ?Diet Order   ? ?       ?  Diet NPO time specified  Diet effective now       ?  ? ?  ?  ? ?  ? ? ?EDUCATION NEEDS:  ? ?No education needs have been identified at this time ? ?Skin:  Skin Assessment: Reviewed RN Assessment (MASD to groin) ? ?Last BM:  05/12/21 ? ?Height:  ? ?Ht Readings from Last 1 Encounters:  ?05/12/21 5\' 8"  (1.727 m)  ? ? ?Weight:  ? ?Wt Readings from Last 1 Encounters:  ?05/12/21 (!) 136.2 kg  ? ? ?Ideal Body Weight:  63.6 kg ? ?BMI:  Body mass index is 45.66 kg/m?. ? ?Estimated Nutritional Needs:  ? ?Kcal:  2000-2200 ? ?Protein:  115-130 grams ? ?Fluid:  >/= 1.8 L ? ? ? ?05/14/21, MS, RD, LDN ?Inpatient Clinical Dietitian ?Please see AMiON for contact information. ? ?

## 2021-05-12 NOTE — Progress Notes (Signed)
eLink Physician-Brief Progress Note ?Patient Name: Gabrielle Carter ?DOB: 1975/07/20 ?MRN: [id number] ? ? ?Date of Service ? 05/12/2021  ?HPI/Events of Note ? Patient with sinus bradycardia in the mid 40's, but increasing her Propofol gtt or giving her Fentanyl pushes causes her to drop into the 30's, she has responded very well to Versed in the past.  ?eICU Interventions ? PRN low dose Versed iv pushes ordered targeting maintenance of a RAS of 0 to -1.  ? ? ? ?  ? ?Frederik Pear ?05/12/2021, 8:54 PM ?

## 2021-05-12 NOTE — Progress Notes (Signed)
Wasted 100mg  of versed in stericylce with RN  ?

## 2021-05-12 NOTE — Progress Notes (Addendum)
? ?NAME:  Gabrielle Carter, MRN:  [id number], DOB:  01-28-1976, LOS: 0 ?ADMISSION DATE:  05/12/2021, CONSULTATION DATE:  05/12/2021 ?REFERRING MD:  [facility], CHIEF COMPLAINT:  respiratory failure  ? ?History of Present Illness:  ?Gabrielle Carter is a 46 y.o. woman with past medical history of bipolar disorder ?schizophrenia with inpatient behavioral health stay, ptsd, hypothyroidism, COPD with ongoing tobacco use disorder.  ? ?History obtained from review of [facility]. She was brought in from home by EMS after being found lethargic at home per mother. Her PO2 was in the 40s and 60s at home per EMS and she was brought in on nonrebreather with a CO2 in the 90s. She was emergently intubated, central line was also placed.  ? ?Labs remarkable noted below: she has transaminitis, TSH 141, leukocytosis with left shift.  ?[facility] given 40 mg methylprednisone, 50 mcg synthroid, vancomycin, azithromycin, ceftriaxone, lasix 20 mg as well as normal saline 1L ? ?Bedside echo by EDP showed pericardial effusion. [facility]  ? ?Pertinent  Medical History  ?COPD. Drug use, PTSD bipolar disorder CHF ? ?Significant Hospital Events: ?Including procedures, antibiotic start and stop dates in addition to other pertinent events   ?3/14 brought in by EMS lethargic, hypercapnic and hypoxemic ?3/14 Intubated, central line placed ?[facility].  ? ?Interim History / Subjective:  ?Sedated and intubated. ? ?Collateral information obtained from mother.  She states that the last 2 weeks patient has not been herself.  She did not observe any infectious symptoms such as fever, coughing or GI symptoms.  Unfortunately patient has not been adherent to her thyroid medication.  She however is taking all of her behavioral medications.  Mother denies any substance use. ? ?Objective   ?Blood pressure 112/84, pulse (!) 51, temperature 97.7 ?F  (36.5 ?C), resp. rate 14, height 5\' 8"  (1.727 m), weight (!) 136.2 kg, SpO2 96 %. ?   ?Vent Mode: PRVC ?FiO2 (%):  [50 %-70 %] 50 % ?Set Rate:  [14 bmp-20 bmp] 14 bmp ?Vt Set:  [470 mL-500 mL] 500 mL ?PEEP:  [5 cmH20] 5 cmH20 ?Plateau Pressure:  [10 cmH20-29 cmH20] 10 cmH20  ? ?Intake/Output Summary (Last 24 hours) at 05/12/2021 0748 ?Last data filed at 05/12/2021 0600 ?Gross per 24 hour  ?Intake 66.68 ml  ?Output 400 ml  ?Net -333.32 ml  ? ?Filed Weights  ? 05/12/21 0130  ?Weight: (!) 136.2 kg  ? ? ?Examination: ?General:  ill appearing.  Intubated.  Minimally responsive. ?HENT: ETT to vent ?Lungs: no wheezes or crackles ?Cardiovascular: Regular rate rhythm.  No murmurs ?Abdomen: soft, nondistended hypoactive bowel sounds ?Extremities: cool, 1+ pitting edema of LE ?Neuro: sedated, not responsive.  Pupils minimally responsive to light. ? ?Resolved Hospital Problem list   ? ? ?Assessment & Plan:  ?Myxedema Coma ?Secondary medication nonadherence. ?TSH 141 on admission.  Free T4 undetectable.  Patient received 200 mcg levothyroxine followed by 50 mcg daily.  Also started on liothyronine 5 mcg Q8h.  ?Patient received empiric hydrocortisone treatment for adrenal efficiency.  A.m. cortisol is 15.  We will obtain ACTH stimulation test. ?-Increase Levothyroxine to 100 mcg daily ?-Continue Lio 5 mcg Q8H ?-Follow T3 and T4 daily ?-TSH in 1 week ? ?Acute metabolic encephalopathy ?Head CT negative for intracranial hemorrhage or acute process. ?Likely from myxedema coma ?Minimize sedation.  Currently on propofol.  ? ?Acute hypoxemic and hypercapnic respiratory  failure ?2 ppd smoker presumed COPD ?Maintain full vent support with lung protective ventilation ?VAP bundle ?Treatment for CAP with ceftriaxone and azithromycin (3/5).  Afebrile overnight.  Leukocytosis secondary to steroid use. ? ?Acute decompensation of heart failure ?Moderate to large pericardial effusion seen on bedside US ?Pending TTE.  Suspect pericardial effusion  secondary to myxedema coma. ?-1 dose of Lasix 40 mg ? ?AKI ?Pre-renal vs ATN vs cardiorenal  ?-UA and urine sodium  ?-Hold off fluid for now ?-Give 1 dose of Lasix 40 mg ? ?Hyponatremia ?Secondary to myxedema coma.  ?-Follow BMP ? ?Transaminitis ?Underlying cholelithiasis and fatty liver ??alcohol use, AST:ALT 2:1 ?Monitor hepatic clearance and medication dosing ? ?Best Practice (right click and "Reselect all SmartList Selections" daily)  ? ?Diet/type: NPO w/ oral meds ?DVT prophylaxis: prophylactic heparin  ?GI prophylaxis: H2B ?Lines: Central line ?Foley:  Yes, and it is still needed ?Code Status:  full code ?Last date of multidisciplinary goals of care discussion: Updated mother 3/15 ?Labs   ?CBC: ?Recent Labs  ?Lab 05/12/21 ?0158 05/12/21 ?0231  ?WBC  --  15.1*  ?HGB 15.6* 12.5  ?HCT 46.0 39.4  ?MCV  --  82.1  ?PLT  --  247  ? ? ?Basic Metabolic Panel: ?Recent Labs  ?Lab 05/12/21 ?0158 05/12/21 ?0231  ?NA 133* 131*  ?K 4.0 3.9  ?CL  --  88*  ?CO2  --  31  ?GLUCOSE  --  135*  ?BUN  --  35*  ?CREATININE  --  1.86*  ?CALCIUM  --  8.2*  ?MG  --  2.1  ?PHOS  --  2.5  ? ?GFR: ?Estimated Creatinine Clearance: 55.4 mL/min (A) (by C-G formula based on SCr of 1.86 mg/dL (H)). ?Recent Labs  ?Lab 05/12/21 ?0231  ?WBC 15.1*  ? ? ?Liver Function Tests: ?Recent Labs  ?Lab 05/12/21 ?0231  ?AST 1,327*  ?ALT 734*  ?ALKPHOS 73  ?BILITOT 0.7  ?PROT 6.3*  ?ALBUMIN 3.3*  ? ?No results for input(s): LIPASE, AMYLASE in the last 168 hours. ?No results for input(s): AMMONIA in the last 168 hours. ? ?ABG ?   ?Component Value Date/Time  ? PHART 7.454 (H) 05/12/2021 0158  ? PCO2ART 46.0 05/12/2021 0158  ? PO2ART 90 05/12/2021 0158  ? HCO3 32.0 (H) 05/12/2021 0158  ? TCO2 33 (H) 05/12/2021 0158  ? O2SAT 97 05/12/2021 0158  ?  ? ?Coagulation Profile: ?No results for input(s): INR, PROTIME in the last 168 hours. ? ?Cardiac Enzymes: ?No results for input(s): CKTOTAL, CKMB, CKMBINDEX, TROPONINI in the last 168 hours. ? ?HbA1C: ?Hgb A1c MFr Bld   ?Date/Time Value Ref Range Status  ?08/22/2019 06:38 AM 6.2 (H) 4.8 - 5.6 % Final  ?  Comment:  ?  (NOTE) ?Pre diabetes:          5.7%-6.4% ? ?Diabetes:              >6.4% ? ?Glycemic control for   <7.0% ?adults with diabetes ?  ?06/05/2018 06:23 AM 5.7 (H) 4.8 - 5.6 % Final  ?  Comment:  ?  (NOTE) ?Pre diabetes:          5.7%-6.4% ?Diabetes:              >6.4% ?Glycemic control for   <7.0% ?adults with diabetes ?  ? ? ?CBG: ?Recent Labs  ?Lab 05/12/21 ?0132 05/12/21 ?0728  ?GLUCAP 177* 124*  ? ? ? ?Review of Systems:   ?Unable to obtain due to patient condition ? ?Past Medical  History:  ?She,  has a past medical history of Asthma and Thyroid disease.  ? ?Surgical History:  ?No past surgical history on file.  ? ?Social History:  ? reports that she has been smoking cigarettes. She has been smoking an average of 1.5 packs per day. She has never used smokeless tobacco. She reports current alcohol use of about 2.0 standard drinks per week. She reports current drug use. Drugs: Cocaine and Marijuana.  ? ?Family History:  ?Her family history is not on file.  ? ?Allergies ?Allergies  ?Allergen Reactions  ? Bee Venom Anaphylaxis  ?  ? ?Home Medications  ?Prior to Admission medications   ?Medication Sig Start Date End Date Taking? Authorizing Provider  ?albuterol (PROVENTIL HFA;VENTOLIN HFA) 108 (90 Base) MCG/ACT inhaler Inhale 2 puffs into the lungs every 6 (six) hours as needed for wheezing or shortness of breath. 06/08/18   Aldean Baker, NP  ?carbamazepine (TEGRETOL XR) 100 MG 12 hr tablet Take 1 tablet (100 mg total) by mouth daily. 08/27/19   Aldean Baker, NP  ?carbamazepine (TEGRETOL XR) 200 MG 12 hr tablet Take 1 tablet (200 mg total) by mouth at bedtime. 08/26/19   Aldean Baker, NP  ?clotrimazole (LOTRIMIN) 1 % cream Apply topically 2 (two) times daily. ?Patient not taking: Reported on 12/01/2019 08/26/19   Aldean Baker, NP  ?FLUoxetine (PROZAC) 40 MG capsule Take 1 capsule (40 mg total) by mouth daily. 08/27/19    Aldean Baker, NP  ?levothyroxine (SYNTHROID) 100 MCG tablet Take 1 tablet (100 mcg total) by mouth daily at 6 (six) AM. 08/27/19   Aldean Baker, NP  ?meloxicam (MOBIC) 7.5 MG tablet Take 1 tablet (7.5 mg

## 2021-05-12 NOTE — H&P (Signed)
? ?NAME:  Gabrielle Carter, MRN:  [id number], DOB:  [birth year], LOS: 0 ?ADMISSION DATE:  05/12/2021, CONSULTATION DATE:  05/12/2021 ?REFERRING MD:  [facility], CHIEF COMPLAINT:  respiratory failure  ? ?History of Present Illness:  ?Gabrielle Carter is a 46 y.o. woman with past medical history of bipolar disorder ?schizophrenia with inpatient behavioral health stay, ptsd, hypothyroidism, COPD with ongoing tobacco use disorder.  ? ?History obtained from [facility]. She was brought in from home by EMS after being found lethargic at home per mother. Her PO2 was in the 40s and 60s at home per EMS and she was brought in on nonrebreather with a CO2 in the 90s. She was emergently intubated, central line was also placed.  ? ?Labs remarkable noted below: she has transaminitis, TSH 141, leukocytosis with left shift.  ?[facility] 40 mg methylprednisone, 50 mcg synthroid, vancomycin, azithromycin, ceftriaxone, lasix 20 mg as well as normal saline 1L ? ?Bedside echo by EDP showed pericardial effusion. [facility].  ? ?Pertinent  Medical History  ?COPD. Drug use, PTSD bipolar disorder CHF ? ?Significant Hospital Events: ?Including procedures, antibiotic start and stop dates in addition to other pertinent events   ?3/14 brought in by EMS lethargic, hypercapnic and hypoxemic ?3/14 Intubated, central line placed ?3[facility] early am.  ? ?Interim History / Subjective:  ? ? ?Objective   ?Blood pressure (!) 119/103, pulse 65, resp. rate 20, height 5\' 8"  (1.727 m), weight (!) 136.2 kg, SpO2 99 %. ?   ?Vent Mode: PRVC ?FiO2 (%):  [70 %] 70 % ?Set Rate:  [20 bmp] 20 bmp ?Vt Set:  [470 mL] 470 mL ?PEEP:  [5 cmH20] 5 cmH20 ?Plateau Pressure:  [29 cmH20] 29 cmH20  ?No intake or output data in the 24 hours ending 05/12/21 0157 ?Filed Weights  ? 05/12/21 0130  ?Weight: (!) 136.2 kg  ? ? ?Examination: ?General: chronically and  acutely ill appearing ?HENT: ETT to vent ?Lungs: diminished bilaterally no wheezes or crackles ?Cardiovascular: diminished, regular, no mrg ?Abdomen: soft, nondistended hypoactive bowel sounds ?Extremities: cool, 1+ pitting edema ?Neuro: sedated, not responsive ? ?Labs and imaging [facility] Records: ?WBC 17.9 with neutrophil predominance ?Hgb 13.6 ?Na 136 K 5.3(hemolyzed) Cl 94 CO2 34 AG 13 BUN 37 Cr 2.1 Glu 167 Ca 8.6 Mg 2.3  ?AST 1782 ?ALT 1108 ?AP 106 ? ?Ammonia 24 ?BNP 12,900  ?Proca 0.34 (elevated) ?TSH 141 (nl 0.5-4.67) ?Free T4 0.1 (low) ?Free T3 1.79 (low) ? ?[facility] - pH 7.45 CO2 46 PO2 90 bicarb 32 ? ?Urine drug screen positive for cocaine ?UA negative for infection ? ?Flu covid viral panel negative, also negative for mycoplasma, parainfluenza, RSV and enterovirus/rhinovirus ? ?EKG shows NSR with occasional PVCs per report ? ?Chest xray personally reviewed shows cardiomegaly and patchy bilateral airspace opacities. ?Head CT was negative for ICH or acute process ?CT Chest reviewed - pericardial effusion, bilateral GGOs ?RUQ U/s shows cholelithiasis and fatty liver with small ascites. ? ? ?Resolved Hospital Problem list   ? ? ?Assessment & Plan:  ? ?Myxedema Coma ?TSH 141 [facility] ?Given steroids and levothyroxine x 1 in ED ?Unfortunately cortisol level not sent before she was given 40 mg methylpred ?Will start empiric stress dose steroids - these can likely be d/ced aver 24 hours ?Start synthroid 200 mcg once followed by 50  mcg daily ?Cytomel 5 mcg every 8 hours ?Thyroid studies - will need daily to monitor response.  ? ?Acute hypoxemic and hypercapnic respiratory failure ?2 ppd smoker presumed COPD ?Maintain full vent support with lung protective ventilation ?Will drop down RR given mild alkalosis while ventilated ?VAP bundle ?Treatment for CAP as below.  ? ?Acute decompensation of heart failure ?Moderate to large pericardial effusion ?Formal  echo in am ?Clinically low suspicion for tamponade physiology. Suspect this is a gradual progression from her myxedema coma ?Cardiology consult in am.  ? ?Severe Sepsis ?Unclear etiology, presumed respiratory with CAP ?Will continue ceftriaxone/azithromycin day 2/5  ? ?Acute metabolic encephalopathy ?Likely from myxedema coma with hyeprcapnia ?Narrow sedation to fentanyl propofol, stop versed ? ?Transaminitis ?Underlying cholelithiasis and fatty liver ?Monitor hepatic clearance and medication dosing ? ? ?Best Practice (right click and "Reselect all SmartList Selections" daily)  ? ?Diet/type: NPO w/ oral meds ?DVT prophylaxis: prophylactic heparin  ?GI prophylaxis: H2B ?Lines: Central line ?Foley:  Yes, and it is still needed ?Code Status:  full code ?Last date of multidisciplinary goals of care discussion [I called the patient's mother Gabrielle Carter at the number [phone] which was provided on sticky note from [facility]. No answer and was unable to leave a message ] ? ?Labs   ?CBC: ?No results for input(s): WBC, NEUTROABS, HGB, HCT, MCV, PLT in the last 168 hours. ? ?Basic Metabolic Panel: ?No results for input(s): NA, K, CL, CO2, GLUCOSE, BUN, CREATININE, CALCIUM, MG, PHOS in the last 168 hours. ?GFR: ?CrCl cannot be calculated (Patient's most recent lab result is older than the maximum 21 days allowed.). ?No results for input(s): PROCALCITON, WBC, LATICACIDVEN in the last 168 hours. ? ?Liver Function Tests: ?No results for input(s): AST, ALT, ALKPHOS, BILITOT, PROT, ALBUMIN in the last 168 hours. ?No results for input(s): LIPASE, AMYLASE in the last 168 hours. ?No results for input(s): AMMONIA in the last 168 hours. ? ?ABG ?No results found for: PHART, PCO2ART, PO2ART, HCO3, TCO2, ACIDBASEDEF, O2SAT  ? ?Coagulation Profile: ?No results for input(s): INR, PROTIME in the last 168 hours. ? ?Cardiac Enzymes: ?No results for input(s): CKTOTAL, CKMB, CKMBINDEX, TROPONINI in the last 168 hours. ? ?HbA1C: ?Hgb  A1c MFr Bld  ?Date/Time Value Ref Range Status  ?08/22/2019 06:38 AM 6.2 (H) 4.8 - 5.6 % Final  ?  Comment:  ?  (NOTE) ?Pre diabetes:          5.7%-6.4% ? ?Diabetes:              >6.4% ? ?Glycemic control for   <7.0% ?adults with diabetes ?  ?06/05/2018 06:23 AM 5.7 (H) 4.8 - 5.6 % Final  ?  Comment:  ?  (NOTE) ?Pre diabetes:          5.7%-6.4% ?Diabetes:              >6.4% ?Glycemic control for   <7.0% ?adults with diabetes ?  ? ? ?CBG: ?Recent Labs  ?Lab 05/12/21 ?0132  ?GLUCAP 177*  ? ? ?Review of Systems:   ?Unable to obtain due to patient condition ? ?Past Medical History:  ?She,  has a past medical history of Asthma and Thyroid disease.  ? ?Surgical History:  ?No past surgical history on file.  ? ?Social History:  ? reports that she has been smoking cigarettes. She has been smoking an average of 1.5 packs per day. She has never used smokeless tobacco. She reports current alcohol use of about 2.0 standard drinks per week.  She reports current drug use. Drugs: Cocaine and Marijuana.  ? ?Family History:  ?Her family history is not on file.  ? ?Allergies ?Allergies  ?Allergen Reactions  ? Bee Venom Anaphylaxis  ?  ? ?Home Medications  ?Prior to Admission medications   ?Medication Sig Start Date End Date Taking? Authorizing Provider  ?albuterol (PROVENTIL HFA;VENTOLIN HFA) 108 (90 Base) MCG/ACT inhaler Inhale 2 puffs into the lungs every 6 (six) hours as needed for wheezing or shortness of breath. 06/08/18   Aldean Baker, NP  ?carbamazepine (TEGRETOL XR) 100 MG 12 hr tablet Take 1 tablet (100 mg total) by mouth daily. 08/27/19   Aldean Baker, NP  ?carbamazepine (TEGRETOL XR) 200 MG 12 hr tablet Take 1 tablet (200 mg total) by mouth at bedtime. 08/26/19   Aldean Baker, NP  ?clotrimazole (LOTRIMIN) 1 % cream Apply topically 2 (two) times daily. ?Patient not taking: Reported on 12/01/2019 08/26/19   Aldean Baker, NP  ?FLUoxetine (PROZAC) 40 MG capsule Take 1 capsule (40 mg total) by mouth daily. 08/27/19   Aldean Baker, NP  ?levothyroxine (SYNTHROID) 100 MCG tablet Take 1 tablet (100 mcg total) by mouth daily at 6 (six) AM. 08/27/19   Aldean Baker, NP  ?meloxicam (MOBIC) 7.5 MG tablet Take 1 tablet (7.5 mg total) by

## 2021-05-12 NOTE — Progress Notes (Signed)
Echocardiogram ?2D Echocardiogram has been performed. ? ?Eduard Roux ?05/12/2021, 10:00 AM ?

## 2021-05-13 DIAGNOSIS — E035 Myxedema coma: Secondary | ICD-10-CM

## 2021-05-13 DIAGNOSIS — J9601 Acute respiratory failure with hypoxia: Secondary | ICD-10-CM | POA: Diagnosis not present

## 2021-05-13 DIAGNOSIS — J9602 Acute respiratory failure with hypercapnia: Secondary | ICD-10-CM | POA: Diagnosis not present

## 2021-05-13 LAB — CBC
HCT: 40.5 % (ref 36.0–46.0)
Hemoglobin: 12.3 g/dL (ref 12.0–15.0)
MCH: 25.4 pg — ABNORMAL LOW (ref 26.0–34.0)
MCHC: 30.4 g/dL (ref 30.0–36.0)
MCV: 83.7 fL (ref 80.0–100.0)
Platelets: 189 10*3/uL (ref 150–400)
RBC: 4.84 MIL/uL (ref 3.87–5.11)
RDW: 16.4 % — ABNORMAL HIGH (ref 11.5–15.5)
WBC: 15.2 10*3/uL — ABNORMAL HIGH (ref 4.0–10.5)
nRBC: 0.1 % (ref 0.0–0.2)

## 2021-05-13 LAB — PHOSPHORUS: Phosphorus: 6 mg/dL — ABNORMAL HIGH (ref 2.5–4.6)

## 2021-05-13 LAB — COMPREHENSIVE METABOLIC PANEL
ALT: 536 U/L — ABNORMAL HIGH (ref 0–44)
AST: 384 U/L — ABNORMAL HIGH (ref 15–41)
Albumin: 3.4 g/dL — ABNORMAL LOW (ref 3.5–5.0)
Alkaline Phosphatase: 75 U/L (ref 38–126)
Anion gap: 10 (ref 5–15)
BUN: 40 mg/dL — ABNORMAL HIGH (ref 6–20)
CO2: 34 mmol/L — ABNORMAL HIGH (ref 22–32)
Calcium: 8.5 mg/dL — ABNORMAL LOW (ref 8.9–10.3)
Chloride: 89 mmol/L — ABNORMAL LOW (ref 98–111)
Creatinine, Ser: 1.54 mg/dL — ABNORMAL HIGH (ref 0.44–1.00)
GFR, Estimated: 42 mL/min — ABNORMAL LOW (ref >60)
Glucose, Bld: 148 mg/dL — ABNORMAL HIGH (ref 70–99)
Potassium: 3.8 mmol/L (ref 3.5–5.1)
Sodium: 133 mmol/L — ABNORMAL LOW (ref 135–145)
Total Bilirubin: 0.6 mg/dL (ref 0.3–1.2)
Total Protein: 6.7 g/dL (ref 6.5–8.1)

## 2021-05-13 LAB — T3, FREE: T3, Free: <0.3 pg/mL — ABNORMAL LOW (ref 2.0–4.4)

## 2021-05-13 LAB — GLUCOSE, CAPILLARY
Glucose-Capillary: 161 mg/dL — ABNORMAL HIGH (ref 70–99)
Glucose-Capillary: 164 mg/dL — ABNORMAL HIGH (ref 70–99)
Glucose-Capillary: 171 mg/dL — ABNORMAL HIGH (ref 70–99)
Glucose-Capillary: 172 mg/dL — ABNORMAL HIGH (ref 70–99)
Glucose-Capillary: 173 mg/dL — ABNORMAL HIGH (ref 70–99)
Glucose-Capillary: 177 mg/dL — ABNORMAL HIGH (ref 70–99)
Glucose-Capillary: 181 mg/dL — ABNORMAL HIGH (ref 70–99)

## 2021-05-13 LAB — MAGNESIUM: Magnesium: 2.4 mg/dL (ref 1.7–2.4)

## 2021-05-13 LAB — HEPATITIS PANEL, ACUTE
HCV Ab: NONREACTIVE
Hep A IgM: NONREACTIVE
Hep B C IgM: NONREACTIVE
Hepatitis B Surface Ag: NONREACTIVE

## 2021-05-13 LAB — TRIGLYCERIDES: Triglycerides: 150 mg/dL — ABNORMAL HIGH (ref <150)

## 2021-05-13 LAB — T4, FREE: Free T4: 0.43 ng/dL — ABNORMAL LOW (ref 0.61–1.12)

## 2021-05-13 MED ORDER — POTASSIUM CHLORIDE 20 MEQ PO PACK
20.0000 meq | PACK | Freq: Once | ORAL | Status: AC
  Administered 2021-05-13: 20 meq
  Filled 2021-05-13: qty 1

## 2021-05-13 MED ORDER — IPRATROPIUM-ALBUTEROL 0.5-2.5 (3) MG/3ML IN SOLN
3.0000 mL | Freq: Once | RESPIRATORY_TRACT | Status: AC
  Administered 2021-05-13: 3 mL via RESPIRATORY_TRACT

## 2021-05-13 MED ORDER — ORAL CARE MOUTH RINSE
15.0000 mL | Freq: Two times a day (BID) | OROMUCOSAL | Status: DC
  Administered 2021-05-13: 15 mL via OROMUCOSAL

## 2021-05-13 MED ORDER — FUROSEMIDE 10 MG/ML IJ SOLN
40.0000 mg | Freq: Once | INTRAMUSCULAR | Status: AC
Start: 2021-05-13 — End: 2021-05-13
  Administered 2021-05-13: 40 mg via INTRAVENOUS
  Filled 2021-05-13: qty 4

## 2021-05-13 MED ORDER — IPRATROPIUM-ALBUTEROL 0.5-2.5 (3) MG/3ML IN SOLN
3.0000 mL | Freq: Four times a day (QID) | RESPIRATORY_TRACT | Status: DC
  Administered 2021-05-13 – 2021-05-17 (×14): 3 mL via RESPIRATORY_TRACT
  Filled 2021-05-13 (×14): qty 3

## 2021-05-13 MED ORDER — FUROSEMIDE 10 MG/ML IJ SOLN: 40.0000 mg | Freq: Once | INTRAMUSCULAR | Status: DC

## 2021-05-13 MED ORDER — COSYNTROPIN 0.25 MG IJ SOLR
0.2500 mg | Freq: Once | INTRAMUSCULAR | Status: AC
  Administered 2021-05-14: 0.25 mg via INTRAVENOUS
  Filled 2021-05-13: qty 0.25

## 2021-05-13 MED ORDER — INSULIN ASPART 100 UNIT/ML IJ SOLN
1.0000 [IU] | INTRAMUSCULAR | Status: DC
  Administered 2021-05-13 – 2021-05-14 (×10): 2 [IU] via SUBCUTANEOUS
  Administered 2021-05-14: 3 [IU] via SUBCUTANEOUS
  Administered 2021-05-14: 2 [IU] via SUBCUTANEOUS
  Administered 2021-05-15: 1 [IU] via SUBCUTANEOUS
  Administered 2021-05-15 (×2): 2 [IU] via SUBCUTANEOUS
  Administered 2021-05-15 – 2021-05-16 (×4): 1 [IU] via SUBCUTANEOUS
  Administered 2021-05-16: 2 [IU] via SUBCUTANEOUS

## 2021-05-13 NOTE — Progress Notes (Signed)
eLink Physician-Brief Progress Note ?Patient Name: Gabrielle Carter ?DOB: 1976-01-22 ?MRN: [id number] ? ? ?Date of Service ? 05/13/2021  ?HPI/Events of Note ? Blood sugar 180 mg / dl.  ?eICU Interventions ? CBG Q 4 hours with SSI coverage ordered.  ? ? ? ?  ? ?Migdalia Dk ?05/13/2021, 1:35 AM ?

## 2021-05-13 NOTE — Procedures (Signed)
Extubation Procedure Note ? ?Patient Details:   ?Name: Gabrielle Carter ?DOB: 23-Mar-1975 ?MRN: [id number] ?  ?Airway Documentation:  ?  ?Vent end date: 05/13/21 Vent end time: 1344  ? ?Evaluation ? O2 sats: stable throughout ?Complications: No apparent complications ?Patient did tolerate procedure well. ?Bilateral Breath Sounds: Rhonchi ?  ?Yes, ? ?Per MD order, pt was extubated to 5L [place]. Pt tolerated well. Prior to extubation pt did have a positive cuff leak. Pt was able to state her name. No stridor note. RN currently at bedside. RT will continue to monitor pt.  ? ?Jorje Guild ?05/13/2021, 1:47 PM ? ?

## 2021-05-13 NOTE — Progress Notes (Signed)
? ?NAME:  Gabrielle Carter, MRN:  [id number], DOB:  1975/06/25, LOS: 1 ?ADMISSION DATE:  05/12/2021, CONSULTATION DATE:  05/13/2021 ?REFERRING MD:  [facility], CHIEF COMPLAINT:  respiratory failure  ? ?History of Present Illness:  ?Gabrielle Carter is a 46 y.o. woman with past medical history of bipolar disorder ?schizophrenia with inpatient behavioral health stay, ptsd, hypothyroidism, COPD with ongoing tobacco use disorder.  ? ?History obtained from review of outpatient records from [facility]. She was brought in from home by EMS after being found lethargic at home per mother. Her PO2 was in the 40s and 60s at home per EMS and she was brought in on nonrebreather with a CO2 in the 90s. She was emergently intubated, central line was also placed.  ? ?Labs remarkable noted below: she has transaminitis, TSH 141, leukocytosis with left shift.  ?In the ED at [facility] given 40 mg methylprednisone, 50 mcg synthroid, vancomycin, azithromycin, ceftriaxone, lasix 20 mg as well as normal saline 1L ? ?Bedside echo by EDP showed pericardial effusion. She was transferred to [facility] for ongoing care.  ? ?Pertinent  Medical History  ?COPD. Drug use, PTSD bipolar disorder CHF ? ?Significant Hospital Events: ?Including procedures, antibiotic start and stop dates in addition to other pertinent events   ?3/14 brought in by EMS lethargic, hypercapnic and hypoxemic ?3/14 Intubated, central line placed ?3/15 transferred to [facility] ICU in early am.  ? ?Interim History / Subjective:  ?Patient is more awake and alert today.  She is able to follow simple commands. ? ?Objective   ?Blood pressure 117/81, pulse (!) 48, temperature (!) 97.5 ?F (36.4 ?C), resp. rate 14, height 5\' 8"  (1.727 m), weight 135.6 kg, SpO2 93 %. ?   ?Vent Mode: PRVC ?FiO2 (%):  [50 %] 50 % ?Set Rate:  [14 bmp] 14 bmp ?Vt Set:  [500 mL] 500 mL ?PEEP:  [5 cmH20] 5 cmH20 ?Plateau Pressure:  [24 cmH20-28 cmH20] 27 cmH20  ? ?Intake/Output Summary (Last 24 hours) at  05/13/2021 0727 ?Last data filed at 05/13/2021 0700 ?Gross per 24 hour  ?Intake 1940.12 ml  ?Output 2825 ml  ?Net -884.88 ml  ? ? ?Filed Weights  ? 05/12/21 0130 05/13/21 0335  ?Weight: (!) 136.2 kg 135.6 kg  ? ? ?Examination: ?General: More awake and alert. ?HENT: ETT to vent ?Lungs: no wheezes or crackles ?Cardiovascular: Regular rate rhythm.  No murmurs ?Abdomen: soft, nondistended, normal bowel sound ?Extremities: 1+ pitting edema of LE ?Neuro: More awake and alert, opening eyes to command.  Able to follow commands. ? ?Resolved Hospital Problem list   ? ? 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 Free T3 remains undetectable.  ?Pending ACTH stimulation test. ?-Continue Levothyroxine to 100 mcg daily ?-Continue Lio 5 mcg Q8H ?-Continue Hydrocortisone 100 mg Q8H ?-Follow free T4 daily and T3 every other day ?-TSH in 1 week ? ?Acute metabolic encephalopathy 2/2 myxedema coma ?Head CT negative for intracranial hemorrhage or acute process. ?Likely from myxedema coma ?She is more awake and alert today.  Will trial SBT. ?Will keep propofol for now for easy switching on and off ? ?Acute hypoxemic and hypercapnic respiratory failure ?2 ppd smoker presumed COPD ?Maintain full vent support with lung protective ventilation ?VAP bundle ?Finished 3 days of Ceftraixone/Azith for CAP ?Trial SBT today ? ?Moderate pericardial effusion ?Questionable HFpEF ?Suspect pericardial effusion secondary to myxedema coma. EF within normal limits. No evidence of cardiac tamponade.  ?-Will give an additional dose of Lasix ? ?  AKI ?Improved with Lasix. Suspect cardiorenal  ?-1 dose of Lasix 40 mg ? ?Mild hyponatremia ?Secondary to myxedema coma.  Improved today  ?-Follow BMP ? ?Transaminitis ?Underlying cholelithiasis and fatty liver ?Suspect shock liver. LFT improved  ?Monitor hepatic clearance and medication dosing ? ?Bipolar disorder ?Schizophrenia ?Patient has not been taking her medications for a  few months. Confirmed with patient's mother. Will not resume ?Best Practice (right click and "Reselect all SmartList Selections" daily)  ? ?Diet/type: NPO w/ oral meds ?DVT prophylaxis: prophylactic heparin  ?GI prophylaxis: H2B ?Lines: Central line ?Foley:  Yes, and it is still needed ?Code Status:  full code ?Last date of multidisciplinary goals of care discussion: Will update family ?Labs   ?CBC: ?Recent Labs  ?Lab 05/12/21 ?0158 05/12/21 ?0231 05/13/21 ?0505  ?WBC  --  15.1* 15.2*  ?HGB 15.6* 12.5 12.3  ?HCT 46.0 39.4 40.5  ?MCV  --  82.1 83.7  ?PLT  --  247 189  ? ? ? ?Basic Metabolic Panel: ?Recent Labs  ?Lab 05/12/21 ?0158 05/12/21 ?0231 05/12/21 ?1844 05/13/21 ?0505  ?NA 133* 131*  --  133*  ?K 4.0 3.9  --  3.8  ?CL  --  88*  --  89*  ?CO2  --  31  --  34*  ?GLUCOSE  --  135*  --  148*  ?BUN  --  35*  --  40*  ?CREATININE  --  1.86*  --  1.54*  ?CALCIUM  --  8.2*  --  8.5*  ?MG  --  2.1 2.3 2.4  ?PHOS  --  2.5 4.0 6.0*  ? ? ?GFR: ?Estimated Creatinine Clearance: 66.7 mL/min (A) (by C-G formula based on SCr of 1.54 mg/dL (H)). ?Recent Labs  ?Lab 05/12/21 ?0231 05/13/21 ?0505  ?WBC 15.1* 15.2*  ? ? ? ?Liver Function Tests: ?Recent Labs  ?Lab 05/12/21 ?0231 05/13/21 ?0505  ?AST 1,327* 384*  ?ALT 734* 536*  ?ALKPHOS 73 75  ?BILITOT 0.7 0.6  ?PROT 6.3* 6.7  ?ALBUMIN 3.3* 3.4*  ? ? ?No results for input(s): LIPASE, AMYLASE in the last 168 hours. ?No results for input(s): AMMONIA in the last 168 hours. ? ?ABG ?   ?Component Value Date/Time  ? PHART 7.454 (H) 05/12/2021 0158  ? PCO2ART 46.0 05/12/2021 0158  ? PO2ART 90 05/12/2021 0158  ? HCO3 32.0 (H) 05/12/2021 0158  ? TCO2 33 (H) 05/12/2021 0158  ? O2SAT 97 05/12/2021 0158  ?  ? ?Coagulation Profile: ?No results for input(s): INR, PROTIME in the last 168 hours. ? ?Cardiac Enzymes: ?No results for input(s): CKTOTAL, CKMB, CKMBINDEX, TROPONINI in the last 168 hours. ? ?HbA1C: ?Hgb A1c MFr Bld  ?Date/Time Value Ref Range Status  ?08/22/2019 06:38 AM 6.2 (H) 4.8 - 5.6  % Final  ?  Comment:  ?  (NOTE) ?Pre diabetes:          5.7%-6.4% ? ?Diabetes:              >6.4% ? ?Glycemic control for   <7.0% ?adults with diabetes ?  ?06/05/2018 06:23 AM 5.7 (H) 4.8 - 5.6 % Final  ?  Comment:  ?  (NOTE) ?Pre diabetes:          5.7%-6.4% ?Diabetes:              >6.4% ?Glycemic control for   <7.0% ?adults with diabetes ?  ? ? ?CBG: ?Recent Labs  ?Lab 05/12/21 ?1558 05/12/21 ?1939 05/12/21 ?2318 05/13/21 ?0254 05/13/21 ?2035  ?GLUCAP 126* 172*  182* 181* 164*  ? ? ? ?Review of Systems:   ?Unable to obtain due to patient condition ? ?Past Medical History:  ?She,  has a past medical history of Asthma and Thyroid disease.  ? ?Surgical History:  ?No past surgical history on file.  ? ?Social History:  ? reports that she has been smoking cigarettes. She has been smoking an average of 1.5 packs per day. She has never used smokeless tobacco. She reports current alcohol use of about 2.0 standard drinks per week. She reports current drug use. Drugs: Cocaine and Marijuana.  ? ?Family History:  ?Her family history is not on file.  ? ?Allergies ?Allergies  ?Allergen Reactions  ? Bee Venom Anaphylaxis  ?  ? ?Home Medications  ?Prior to Admission medications   ?Medication Sig Start Date End Date Taking? Authorizing Provider  ?albuterol (PROVENTIL HFA;VENTOLIN HFA) 108 (90 Base) MCG/ACT inhaler Inhale 2 puffs into the lungs every 6 (six) hours as needed for wheezing or shortness of breath. 06/08/18   Aldean BakerSykes, Janet E, NP  ?carbamazepine (TEGRETOL XR) 100 MG 12 hr tablet Take 1 tablet (100 mg total) by mouth daily. 08/27/19   Aldean BakerSykes, Janet E, NP  ?carbamazepine (TEGRETOL XR) 200 MG 12 hr tablet Take 1 tablet (200 mg total) by mouth at bedtime. 08/26/19   Aldean BakerSykes, Janet E, NP  ?clotrimazole (LOTRIMIN) 1 % cream Apply topically 2 (two) times daily. ?Patient not taking: Reported on 12/01/2019 08/26/19   Aldean BakerSykes, Janet E, NP  ?FLUoxetine (PROZAC) 40 MG capsule Take 1 capsule (40 mg total) by mouth daily. 08/27/19   Aldean BakerSykes, Janet E,  NP  ?levothyroxine (SYNTHROID) 100 MCG tablet Take 1 tablet (100 mcg total) by mouth daily at 6 (six) AM. 08/27/19   Aldean BakerSykes, Janet E, NP  ?meloxicam (MOBIC) 7.5 MG tablet Take 1 tablet (7.5 mg total) by mouth

## 2021-05-14 DIAGNOSIS — J9601 Acute respiratory failure with hypoxia: Secondary | ICD-10-CM | POA: Diagnosis not present

## 2021-05-14 DIAGNOSIS — J9602 Acute respiratory failure with hypercapnia: Secondary | ICD-10-CM | POA: Diagnosis not present

## 2021-05-14 LAB — POCT I-STAT 7, (LYTES, BLD GAS, ICA,H+H)
Acid-Base Excess: 13 mmol/L — ABNORMAL HIGH (ref 0.0–2.0)
Acid-Base Excess: 13 mmol/L — ABNORMAL HIGH (ref 0.0–2.0)
Acid-Base Excess: 13 mmol/L — ABNORMAL HIGH (ref 0.0–2.0)
Bicarbonate: 42.8 mmol/L — ABNORMAL HIGH (ref 20.0–28.0)
Bicarbonate: 43 mmol/L — ABNORMAL HIGH (ref 20.0–28.0)
Bicarbonate: 42.8 mmol/L — ABNORMAL HIGH (ref 20.0–28.0)
Calcium, Ion: 1.18 mmol/L (ref 1.15–1.40)
Calcium, Ion: 1.22 mmol/L (ref 1.15–1.40)
Calcium, Ion: 1.2 mmol/L (ref 1.15–1.40)
HCT: 42 % (ref 36.0–46.0)
HCT: 42 % (ref 36.0–46.0)
HCT: 41 % (ref 36.0–46.0)
Hemoglobin: 14.3 g/dL (ref 12.0–15.0)
Hemoglobin: 14.3 g/dL (ref 12.0–15.0)
Hemoglobin: 13.9 g/dL (ref 12.0–15.0)
O2 Saturation: 94 %
O2 Saturation: 98 %
O2 Saturation: 97 %
Patient temperature: 98.3
Patient temperature: 98.6
Patient temperature: 98.6
Potassium: 4 mmol/L (ref 3.5–5.1)
Potassium: 3.9 mmol/L (ref 3.5–5.1)
Potassium: 4 mmol/L (ref 3.5–5.1)
Sodium: 138 mmol/L (ref 135–145)
Sodium: 140 mmol/L (ref 135–145)
Sodium: 139 mmol/L (ref 135–145)
TCO2: 45 mmol/L — ABNORMAL HIGH (ref 22–32)
TCO2: 45 mmol/L — ABNORMAL HIGH (ref 22–32)
TCO2: 45 mmol/L — ABNORMAL HIGH (ref 22–32)
pCO2 arterial: 78.1 mmHg (ref 32–48)
pCO2 arterial: 81.5 mmHg (ref 32–48)
pCO2 arterial: 79.1 mmHg (ref 32–48)
pH, Arterial: 7.345 — ABNORMAL LOW (ref 7.35–7.45)
pH, Arterial: 7.331 — ABNORMAL LOW (ref 7.35–7.45)
pH, Arterial: 7.341 — ABNORMAL LOW (ref 7.35–7.45)
pO2, Arterial: 79 mmHg — ABNORMAL LOW (ref 83–108)
pO2, Arterial: 125 mmHg — ABNORMAL HIGH (ref 83–108)
pO2, Arterial: 105 mmHg (ref 83–108)

## 2021-05-14 LAB — CBC
HCT: 40.9 % (ref 36.0–46.0)
Hemoglobin: 12.1 g/dL (ref 12.0–15.0)
MCH: 25.9 pg — ABNORMAL LOW (ref 26.0–34.0)
MCHC: 29.6 g/dL — ABNORMAL LOW (ref 30.0–36.0)
MCV: 87.4 fL (ref 80.0–100.0)
Platelets: 201 10*3/uL (ref 150–400)
RBC: 4.68 MIL/uL (ref 3.87–5.11)
RDW: 16.8 % — ABNORMAL HIGH (ref 11.5–15.5)
WBC: 15.4 10*3/uL — ABNORMAL HIGH (ref 4.0–10.5)
nRBC: 0.3 % — ABNORMAL HIGH (ref 0.0–0.2)

## 2021-05-14 LAB — GLUCOSE, CAPILLARY
Glucose-Capillary: 179 mg/dL — ABNORMAL HIGH (ref 70–99)
Glucose-Capillary: 185 mg/dL — ABNORMAL HIGH (ref 70–99)
Glucose-Capillary: 229 mg/dL — ABNORMAL HIGH (ref 70–99)
Glucose-Capillary: 194 mg/dL — ABNORMAL HIGH (ref 70–99)
Glucose-Capillary: 164 mg/dL — ABNORMAL HIGH (ref 70–99)
Glucose-Capillary: 175 mg/dL — ABNORMAL HIGH (ref 70–99)

## 2021-05-14 LAB — COMPREHENSIVE METABOLIC PANEL
ALT: 346 U/L — ABNORMAL HIGH (ref 0–44)
AST: 145 U/L — ABNORMAL HIGH (ref 15–41)
Albumin: 3.4 g/dL — ABNORMAL LOW (ref 3.5–5.0)
Alkaline Phosphatase: 75 U/L (ref 38–126)
Anion gap: 11 (ref 5–15)
BUN: 35 mg/dL — ABNORMAL HIGH (ref 6–20)
CO2: 36 mmol/L — ABNORMAL HIGH (ref 22–32)
Calcium: 8.6 mg/dL — ABNORMAL LOW (ref 8.9–10.3)
Chloride: 92 mmol/L — ABNORMAL LOW (ref 98–111)
Creatinine, Ser: 1.17 mg/dL — ABNORMAL HIGH (ref 0.44–1.00)
GFR, Estimated: 58 mL/min — ABNORMAL LOW (ref >60)
Glucose, Bld: 179 mg/dL — ABNORMAL HIGH (ref 70–99)
Potassium: 4.1 mmol/L (ref 3.5–5.1)
Sodium: 139 mmol/L (ref 135–145)
Total Bilirubin: 0.6 mg/dL (ref 0.3–1.2)
Total Protein: 6.5 g/dL (ref 6.5–8.1)

## 2021-05-14 LAB — T3: T3, Total: 46 ng/dL — ABNORMAL LOW (ref 71–180)

## 2021-05-14 LAB — T4, FREE: Free T4: 0.32 ng/dL — ABNORMAL LOW (ref 0.61–1.12)

## 2021-05-14 LAB — ACTH STIMULATION, 3 TIME POINTS
Cortisol, 30 Min: 93.1 ug/dL
Cortisol, 60 Min: 88.7 ug/dL
Cortisol, Base: 89.8 ug/dL

## 2021-05-14 IMAGING — CR DG CHEST 2V
2 series · 2 of 2 positions shown · non-contrast
Comparison: None.

CLINICAL DATA: Chest pain

EXAM:
CHEST - 2 VIEW

[chest lat]
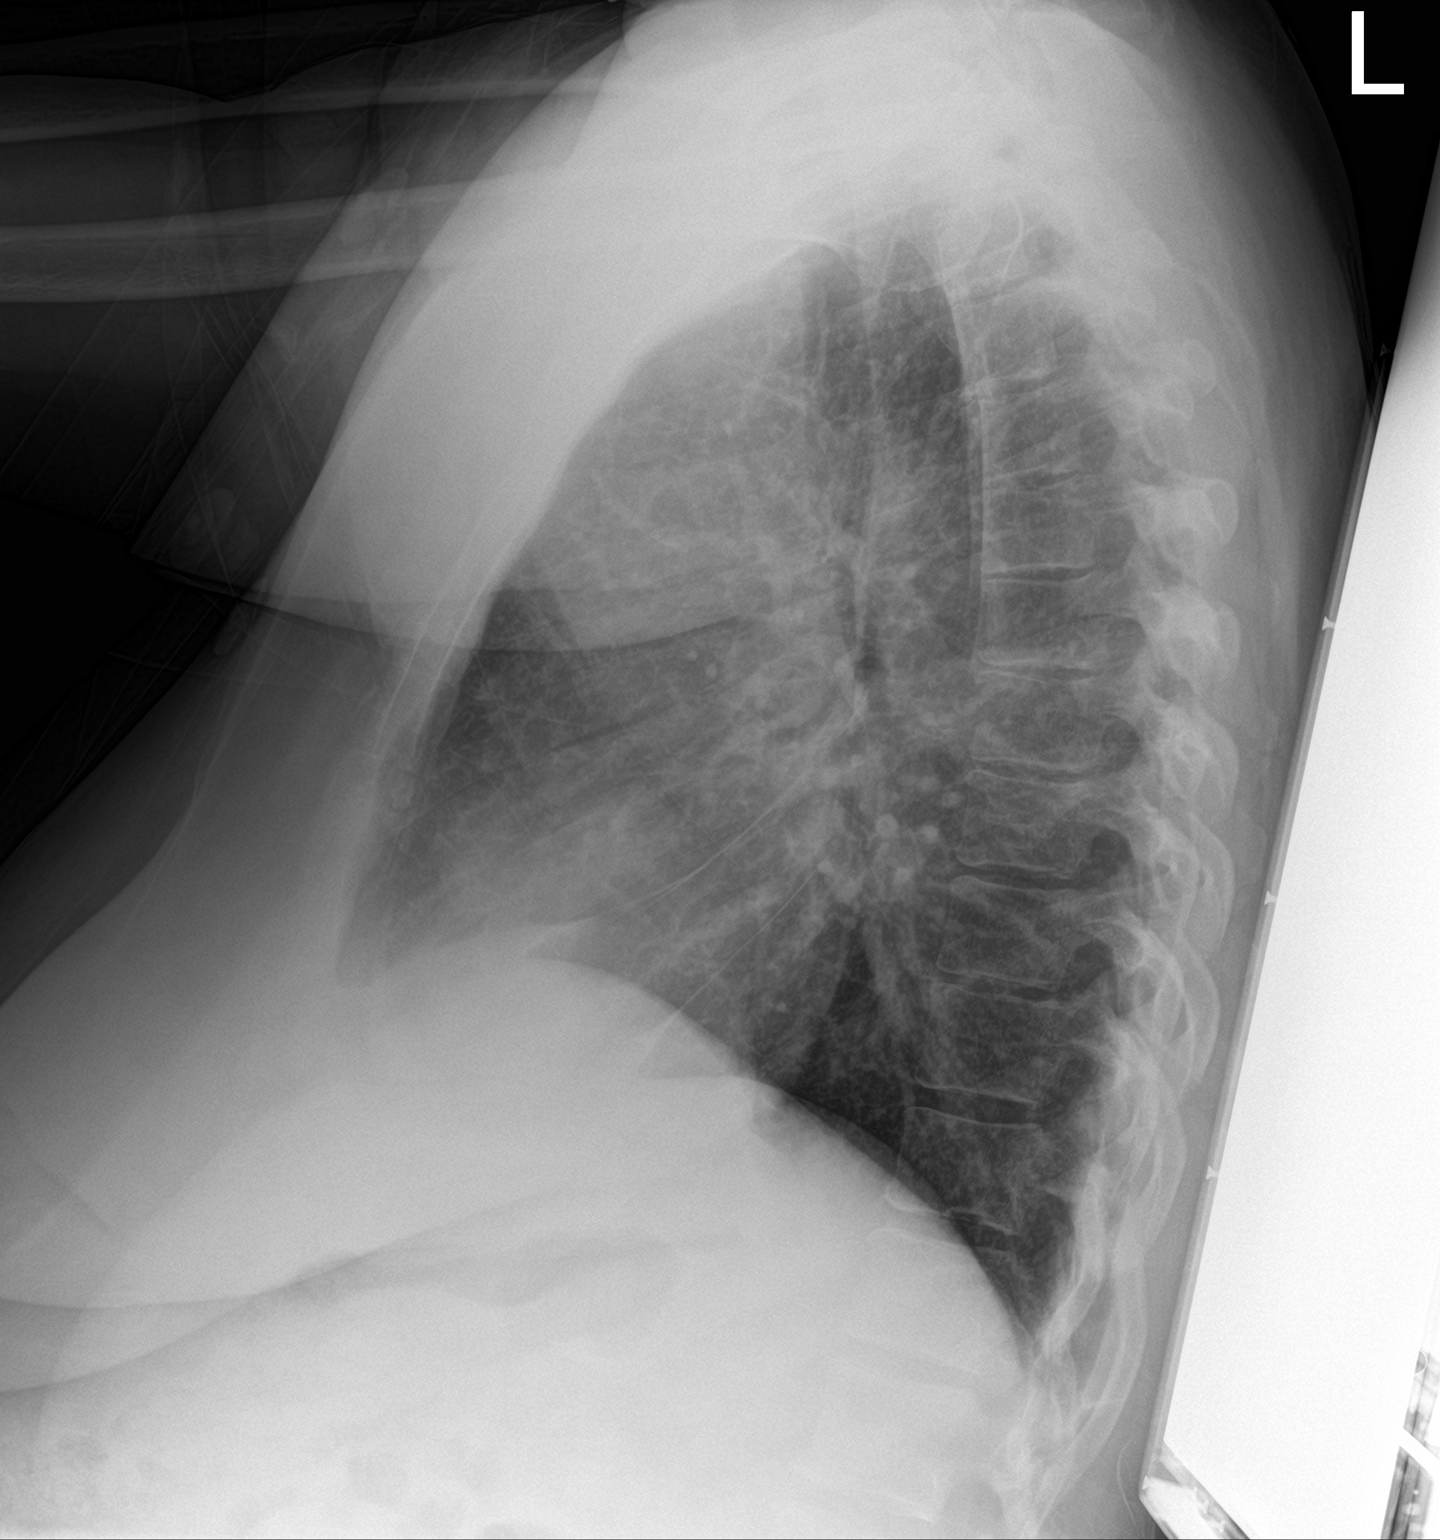

[chest ap]
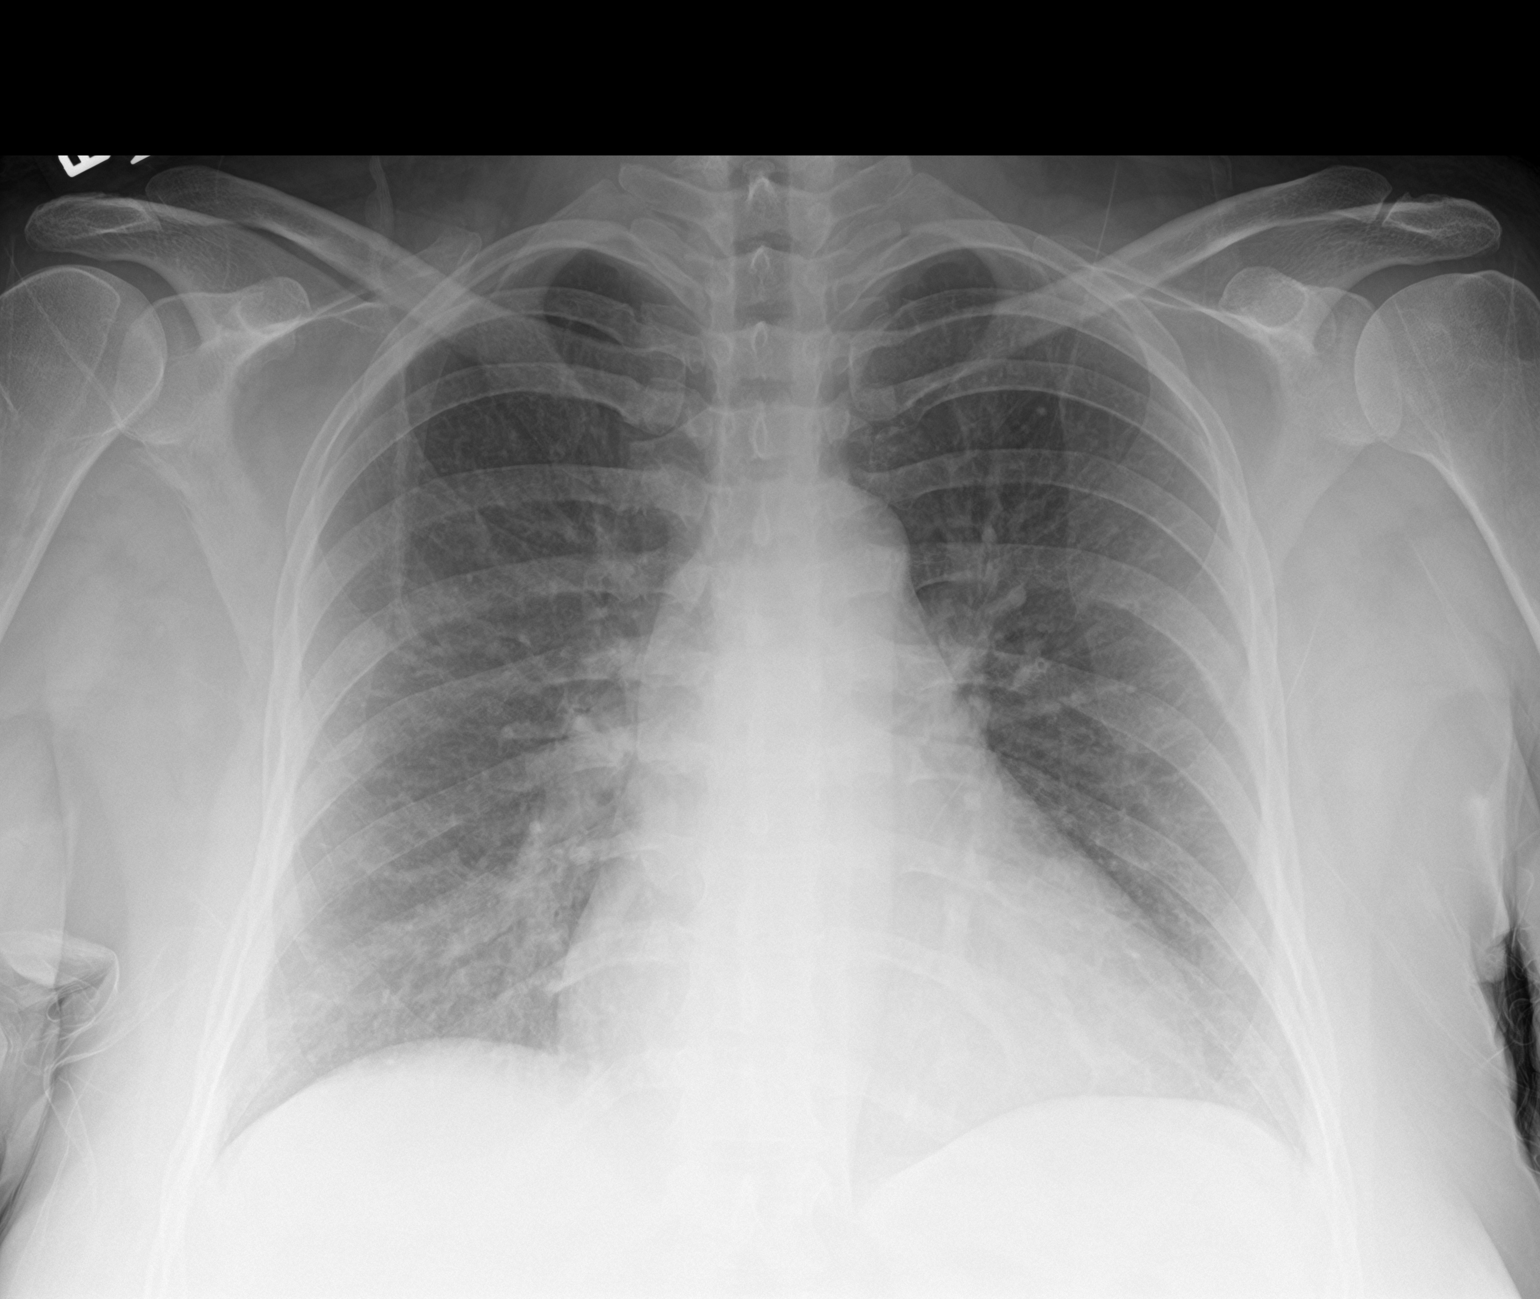

[2 of 2 positions shown; findings below may reference images not displayed]

FINDINGS: The heart size and mediastinal contours are within normal limits.
Both lungs are clear. The visualized skeletal structures are
unremarkable.
IMPRESSION: No active cardiopulmonary disease.

## 2021-05-14 MED ORDER — CHLORHEXIDINE GLUCONATE 0.12 % MT SOLN
15.0000 mL | Freq: Two times a day (BID) | OROMUCOSAL | Status: DC
  Administered 2021-05-14 – 2021-05-17 (×6): 15 mL via OROMUCOSAL
  Filled 2021-05-14 (×7): qty 15

## 2021-05-14 MED ORDER — LEVOTHYROXINE SODIUM 100 MCG/5ML IV SOLN
50.0000 ug | Freq: Every day | INTRAVENOUS | Status: DC
  Filled 2021-05-14: qty 5

## 2021-05-14 MED ORDER — ORAL CARE MOUTH RINSE
15.0000 mL | Freq: Two times a day (BID) | OROMUCOSAL | Status: DC
  Administered 2021-05-14 – 2021-05-22 (×10): 15 mL via OROMUCOSAL

## 2021-05-14 MED ORDER — LEVOTHYROXINE SODIUM 100 MCG PO TABS
100.0000 ug | ORAL_TABLET | Freq: Every day | ORAL | Status: DC
  Administered 2021-05-14: 100 ug via ORAL
  Filled 2021-05-14: qty 1

## 2021-05-14 MED ORDER — LEVOTHYROXINE SODIUM 100 MCG PO TABS: 100.0000 ug | ORAL_TABLET | Freq: Every day | ORAL | Status: DC

## 2021-05-14 MED ORDER — FAMOTIDINE 20 MG PO TABS
20.0000 mg | ORAL_TABLET | Freq: Two times a day (BID) | ORAL | Status: DC
  Administered 2021-05-14: 20 mg via ORAL
  Filled 2021-05-14: qty 1

## 2021-05-14 MED ORDER — FUROSEMIDE 10 MG/ML IJ SOLN
40.0000 mg | Freq: Once | INTRAMUSCULAR | Status: AC
  Administered 2021-05-14: 40 mg via INTRAVENOUS
  Filled 2021-05-14: qty 4

## 2021-05-14 MED ORDER — ENSURE ENLIVE PO LIQD
237.0000 mL | Freq: Two times a day (BID) | ORAL | Status: DC
  Administered 2021-05-16 – 2021-05-22 (×12): 237 mL via ORAL

## 2021-05-14 MED ORDER — ADULT MULTIVITAMIN W/MINERALS CH
1.0000 | ORAL_TABLET | Freq: Every day | ORAL | Status: DC
  Administered 2021-05-16 – 2021-05-22 (×7): 1 via ORAL
  Filled 2021-05-14 (×7): qty 1

## 2021-05-14 MED ORDER — FAMOTIDINE IN NACL 20-0.9 MG/50ML-% IV SOLN
20.0000 mg | INTRAVENOUS | Status: DC
  Administered 2021-05-14: 20 mg via INTRAVENOUS
  Filled 2021-05-14: qty 50

## 2021-05-14 NOTE — Progress Notes (Signed)
Nutrition Follow-up ? ?DOCUMENTATION CODES:  ? ?Not applicable ? ?INTERVENTION:  ? ?- Ensure Enlive po BID, each supplement provides 350 kcal and 20 grams of protein ? ?- MVI with minerals daily ? ?NUTRITION DIAGNOSIS:  ? ?Inadequate oral intake related to inability to eat as evidenced by NPO status. ? ?Progressing, pt now on Regular diet ? ?GOAL:  ? ?Patient will meet greater than or equal to 90% of their needs ? ?Progressing ? ?MONITOR:  ? ?Vent status, Labs, Weight trends, TF tolerance, I & O's ? ?REASON FOR ASSESSMENT:  ? ?Ventilator, Consult ?Enteral/tube feeding initiation and management ? ?ASSESSMENT:  ? ?46 year old female who presented from [facility] on 3/15 with respiratory failure. PMH of bipolar disorder, schizophrenia, PTSD, hypothyroidism, COPD, tobacco use, CHF. Pt was intubated on 3/14. Pt admitted with myxedema coma, acute hypoxemic and hypercapnic respiratory failure, severe sepsis, AKI. ? ?03/16 - extubated, diet advanced to GI soft ? ?Discussed pt with RN and during ICU rounds. Pt currently sleeping on BiPAP. Pt ate 100% of breakfast this morning. RD to order oral nutrition supplements to aid pt in meeting kcal and protein needs during admission. Will also order daily MVI with minerals. ? ?Admit weight: 136.2 kg ?Current weight: 134.5 kg ? ?Pt with generalized non-pitting edema. ? ?Meal Completion: 100% x 2 documented meals ? ?Medications reviewed and include: pepcid, IV solu-cortef, SSI q 4 hours ? ?Labs reviewed: BUN 35, creatinine 1.17, elevated LFTs, WBC 15.4, TSH 95.921, T3 46, T4 0.32 ?CBG's: 166-185 x 24 hours ? ?UOP: 1400 ml x 24 hours ?I/O's: -1.2 L since admit ? ?Diet Order:   ?Diet Order   ? ?       ?  DIET SOFT Room service appropriate? Yes; Fluid consistency: Thin  Diet effective now       ?  ? ?  ?  ? ?  ? ? ?EDUCATION NEEDS:  ? ?No education needs have been identified at this time ? ?Skin:  Skin Assessment: Reviewed RN Assessment (MASD to groin) ? ?Last BM:  05/14/21 small  type 5 ? ?Height:  ? ?Ht Readings from Last 1 Encounters:  ?05/12/21 5\' 8"  (1.727 m)  ? ? ?Weight:  ? ?Wt Readings from Last 1 Encounters:  ?05/14/21 134.5 kg  ? ? ?Ideal Body Weight:  63.6 kg ? ?BMI:  Body mass index is 45.09 kg/m?. ? ?Estimated Nutritional Needs:  ? ?Kcal:  2000-2200 ? ?Protein:  115-130 grams ? ?Fluid:  >/= 1.8 L ? ? ? ?05/16/21, MS, RD, LDN ?Inpatient Clinical Dietitian ?Please see AMiON for contact information. ? ?

## 2021-05-14 NOTE — Progress Notes (Signed)
Critical ABG results were given to Dr. Mannam. ?

## 2021-05-14 NOTE — Progress Notes (Signed)
? ?NAME:  Gabrielle Carter, MRN:  [id number], DOB:  [birth year], LOS: 2 ?ADMISSION DATE:  05/12/2021, CONSULTATION DATE:  05/14/2021 ?REFERRING MD:  [facility], CHIEF COMPLAINT:  respiratory failure  ? ?History of Present Illness:  ?Gabrielle Carter is a 46 y.o. woman with past medical history of bipolar disorder ?schizophrenia with inpatient behavioral health stay, ptsd, hypothyroidism, COPD with ongoing tobacco use disorder.  ? ?History obtained from review of outpatient records from [facility]. She was brought in from home by EMS after being found lethargic at home per mother. Her PO2 was in the 40s and 60s at home per EMS and she was brought in on nonrebreather with a CO2 in the 90s. She was emergently intubated, central line was also placed.  ? ?Labs remarkable noted below: she has transaminitis, TSH 141, leukocytosis with left shift.  ?In the ED at [facility] given 40 mg methylprednisone, 50 mcg synthroid, vancomycin, azithromycin, ceftriaxone, lasix 20 mg as well as normal saline 1L ? ?Bedside echo by EDP showed pericardial effusion. She was transferred to [facility] for ongoing care.  ? ?Pertinent  Medical History  ?COPD. Drug use, PTSD bipolar disorder CHF ? ?Significant Hospital Events: ?Including procedures, antibiotic start and stop dates in addition to other pertinent events   ?3/14 brought in by EMS lethargic, hypercapnic and hypoxemic ?3/14 Intubated, central line placed ?3/15 transferred to [facility] ICU in early am.  ? ?Interim History / Subjective:  ?Patient is somnolent this morning but awakes to verbal stimulation.  Able to answer question appropriately. ? ?Say that she smoked 1 pack a day for many years.  Denies alcohol use.  Said that she has OSA and uses CPAP at home at night. ? ?Objective   ?Blood pressure 119/83, pulse 74, temperature 98.3 ?F (36.8 ?C), temperature source Oral, resp. rate 20, height 5\' 8"  (1.727 m), weight 134.5 kg, SpO2 94 %. ?   ?Vent Mode: PSV;CPAP ?FiO2 (%):  [40 %-50 %] 40  % ?Set Rate:  [12 bmp-14 bmp] 12 bmp ?Vt Set:  [500 mL] 500 mL ?PEEP:  [5 cmH20] 5 cmH20 ?Pressure Support:  [14 cmH20] 14 cmH20 ?Plateau Pressure:  [28 cmH20] 28 cmH20  ? ?Intake/Output Summary (Last 24 hours) at 05/14/2021 0718 ?Last data filed at 05/14/2021 0600 ?Gross per 24 hour  ?Intake 1080.54 ml  ?Output 1400 ml  ?Net -319.46 ml  ? ? ?Filed Weights  ? 05/12/21 0130 05/13/21 0335 05/14/21 0500  ?Weight: (!) 136.2 kg 135.6 kg 134.5 kg  ? ? ?Examination: ?General: Somnolent but awakes to verbal stimulation ?HENT: Sclera anicteric. ?Lungs: Mild wheezes ?Cardiovascular: Regular rate rhythm.  No murmurs ?Extremities: Trace edema of bilateral lower extremity ?Neuro: Somnolent but awake to verbal stimulation. ? ?Resolved Hospital Problem list   ?Acute hypoxic respiratory failure requiring mechanical ventilation ?Hyponatremia ? 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  Switch to levothyroxine p.o. 100 mcg daily.   ?-Pending ACTH stim test.  If negative, will discontinue hydrocortisone ?-Repeat TSH in 1 week ? ?Acute metabolic encephalopathy 2/2 myxedema coma ?Possible hypercapnia 2/2 OSA and COPD ?Head CT negative for intracranial hemorrhage or acute process. ?Her mental status with thyroid repletion. ?She is more somnolent this morning suspect hypercapnia from OSA and COPD.  Will obtain ABG. ?-Order CPAP nightly ? ?COPD ?-Scheduled DuoNebs ? ?Moderate pericardial effusion ?Questionable HFpEF ?Pericardial effusion secondary to myxedema coma. EF within normal limits. No evidence of cardiac tamponade.  ?-Hold  off on Lasix today.  Appears euvolemic. ? ?AKI ?Almost back to baseline. ?-Encourage p.o. intake ? ?Transaminitis ?Underlying cholelithiasis and fatty liver ?Suspect shock liver. LFT improved  ?Monitor hepatic clearance and medication dosing ? ?Bipolar disorder ?Schizophrenia ?Patient has not been taking her medications for a few months.  Confirmed with patient's mother. Will not resume ?Best Practice (right click and "Reselect all SmartList Selections" daily)  ? ?Diet/type: soft diet ?DVT prophylaxis: prophylactic heparin  ?GI prophylaxis: H2B ?Lines: NA ?Foley:  NA ?Code Status:  full code ?Last date of multidisciplinary goals of care discussion: updated family 3/16 ?Labs   ?CBC: ?Recent Labs  ?Lab 05/12/21 ?0158 05/12/21 ?0231 05/13/21 ?0505 05/14/21 ?[id number]  ?WBC  --  15.1* 15.2* 15.4*  ?HGB 15.6* 12.5 12.3 12.1  ?HCT 46.0 39.4 40.5 40.9  ?MCV  --  82.1 83.7 87.4  ?PLT  --  247 189 201  ? ? ? ?Basic Metabolic Panel: ?Recent Labs  ?Lab 05/12/21 ?0158 05/12/21 ?0231 05/12/21 ?1844 05/13/21 ?0505 05/14/21 ?[id number]  ?NA 133* 131*  --  133* 139  ?K 4.0 3.9  --  3.8 4.1  ?CL  --  88*  --  89* 92*  ?CO2  --  31  --  34* 36*  ?GLUCOSE  --  135*  --  148* 179*  ?BUN  --  35*  --  40* 35*  ?CREATININE  --  1.86*  --  1.54* 1.17*  ?CALCIUM  --  8.2*  --  8.5* 8.6*  ?MG  --  2.1 2.3 2.4  --   ?PHOS  --  2.5 4.0 6.0*  --   ? ? ?GFR: ?Estimated Creatinine Clearance: 87.4 mL/min (A) (by C-G formula based on SCr of 1.17 mg/dL (H)). ?Recent Labs  ?Lab 05/12/21 ?0231 05/13/21 ?0505 05/14/21 ?[id number]  ?WBC 15.1* 15.2* 15.4*  ? ? ? ?Liver Function Tests: ?Recent Labs  ?Lab 05/12/21 ?0231 05/13/21 ?0505 05/14/21 ?[id number]  ?AST 1,327* 384* 145*  ?ALT 734* 536* 346*  ?ALKPHOS 73 75 75  ?BILITOT 0.7 0.6 0.6  ?PROT 6.3* 6.7 6.5  ?ALBUMIN 3.3* 3.4* 3.4*  ? ? ?No results for input(s): LIPASE, AMYLASE in the last 168 hours. ?No results for input(s): AMMONIA in the last 168 hours. ? ?ABG ?   ?Component Value Date/Time  ? PHART 7.454 (H) 05/12/2021 0158  ? PCO2ART 46.0 05/12/2021 0158  ? PO2ART 90 05/12/2021 0158  ? HCO3 32.0 (H) 05/12/2021 0158  ? TCO2 33 (H) 05/12/2021 0158  ? O2SAT 97 05/12/2021 0158  ?  ? ?Coagulation Profile: ?No results for input(s): INR, PROTIME in the last 168 hours. ? ?Cardiac Enzymes: ?No results for input(s): CKTOTAL, CKMB, CKMBINDEX, TROPONINI in the last  168 hours. ? ?HbA1C: ?Hgb A1c MFr Bld  ?Date/Time Value Ref Range Status  ?08/22/2019 06:38 AM 6.2 (H) 4.8 - 5.6 % Final  ?  Comment:  ?  (NOTE) ?Pre diabetes:          5.7%-6.4% ? ?Diabetes:              >6.4% ? ?Glycemic control for   <7.0% ?adults with diabetes ?  ?06/05/2018 06:23 AM 5.7 (H) 4.8 - 5.6 % Final  ?  Comment:  ?  (NOTE) ?Pre diabetes:          5.7%-6.4% ?Diabetes:              >6.4% ?Glycemic control for   <7.0% ?adults with diabetes ?  ? ? ?CBG: ?Recent Labs  ?  Lab 05/13/21 ?1608 05/13/21 ?1919 05/13/21 ?2339 05/14/21 ?0325 05/14/21 ?0715  ?GLUCAP 171* 161* 177* 179* 185*  ? ? ? ?Review of Systems:   ?Unable to obtain due to patient condition ? ?Past Medical History:  ?She,  has a past medical history of Asthma and Thyroid disease.  ? ?Surgical History:  ?No past surgical history on file.  ? ?Social History:  ? reports that she has been smoking cigarettes. She has been smoking an average of 1.5 packs per day. She has never used smokeless tobacco. She reports current alcohol use of about 2.0 standard drinks per week. She reports current drug use. Drugs: Cocaine and Marijuana.  ? ?Family History:  ?Her family history is not on file.  ? ?Allergies ?Allergies  ?Allergen Reactions  ? Bee Venom Anaphylaxis  ?  ? ?Home Medications  ?Prior to Admission medications   ?Medication Sig Start Date End Date Taking? Authorizing Provider  ?albuterol (PROVENTIL HFA;VENTOLIN HFA) 108 (90 Base) MCG/ACT inhaler Inhale 2 puffs into the lungs every 6 (six) hours as needed for wheezing or shortness of breath. 06/08/18   Connye Burkitt, NP  ?carbamazepine (TEGRETOL XR) 100 MG 12 hr tablet Take 1 tablet (100 mg total) by mouth daily. 08/27/19   Connye Burkitt, NP  ?carbamazepine (TEGRETOL XR) 200 MG 12 hr tablet Take 1 tablet (200 mg total) by mouth at bedtime. 08/26/19   Connye Burkitt, NP  ?clotrimazole (LOTRIMIN) 1 % cream Apply topically 2 (two) times daily. ?Patient not taking: Reported on 12/01/2019 08/26/19   Connye Burkitt, NP  ?FLUoxetine (PROZAC) 40 MG capsule Take 1 capsule (40 mg total) by mouth daily. 08/27/19   Connye Burkitt, NP  ?levothyroxine (SYNTHROID) 100 MCG tablet Take 1 tablet (100 mcg total) by mouth daily at

## 2021-05-14 NOTE — Progress Notes (Addendum)
Critical ABG results given to Dr. Vaughan Browner. ?

## 2021-05-14 NOTE — Progress Notes (Signed)
PT Cancellation Note ? ?Patient Details ?Name: Gabrielle Carter ?MRN: [id number] ?DOB: 1975-06-08 ? ? ?Cancelled Treatment:    Reason Eval/Treat Not Completed: (P) Medical issues which prohibited therapy Pt continues to be on BiPAP and has difficulty maintaining SpO2 >90%O2. PT will follow back for Evaluation tomorrow.  ? ?[name] B. Beverely Risen PT, DPT ?Acute Rehabilitation Services ?Pager [phone] ?Office [phone] ? ? ? ?[name] ?05/14/2021, 1:38 PM ? ? ?

## 2021-05-14 NOTE — Progress Notes (Signed)
Critical ABG results were given to Dr. Isaiah Serge. ?

## 2021-05-14 NOTE — Progress Notes (Signed)
PT Cancellation Note ? ?Patient Details ?Name: Gabrielle Carter ?MRN: [id number] ?DOB: November 12, 1975 ? ? ?Cancelled Treatment:    Reason Eval/Treat Not Completed: Medical issues which prohibited therapy ?Arterial CO2 78.1, placed back on BiPAP SpO2 88%. PT will follow back this afternoon for Evaluation as able. ? ?[name] B. Beverely Risen PT, DPT ?Acute Rehabilitation Services ?Pager [phone] ?Office [phone] ?  ? ?[name] ?05/14/2021, 9:13 AM ? ? ?

## 2021-05-15 ENCOUNTER — Inpatient Hospital Stay (HOSPITAL_COMMUNITY): Payer: Medicaid Other

## 2021-05-15 DIAGNOSIS — J9601 Acute respiratory failure with hypoxia: Secondary | ICD-10-CM | POA: Diagnosis not present

## 2021-05-15 DIAGNOSIS — J9602 Acute respiratory failure with hypercapnia: Secondary | ICD-10-CM | POA: Diagnosis not present

## 2021-05-15 LAB — GLUCOSE, CAPILLARY
Glucose-Capillary: 139 mg/dL — ABNORMAL HIGH (ref 70–99)
Glucose-Capillary: 143 mg/dL — ABNORMAL HIGH (ref 70–99)
Glucose-Capillary: 145 mg/dL — ABNORMAL HIGH (ref 70–99)
Glucose-Capillary: 146 mg/dL — ABNORMAL HIGH (ref 70–99)
Glucose-Capillary: 170 mg/dL — ABNORMAL HIGH (ref 70–99)
Glucose-Capillary: 176 mg/dL — ABNORMAL HIGH (ref 70–99)

## 2021-05-15 LAB — POCT I-STAT 7, (LYTES, BLD GAS, ICA,H+H)
Acid-Base Excess: 17 mmol/L — ABNORMAL HIGH (ref 0.0–2.0)
Bicarbonate: 46.1 mmol/L — ABNORMAL HIGH (ref 20.0–28.0)
Calcium, Ion: 1.22 mmol/L (ref 1.15–1.40)
HCT: 42 % (ref 36.0–46.0)
Hemoglobin: 14.3 g/dL (ref 12.0–15.0)
O2 Saturation: 98 %
Patient temperature: 98.5
Potassium: 3.8 mmol/L (ref 3.5–5.1)
Sodium: 140 mmol/L (ref 135–145)
TCO2: 48 mmol/L — ABNORMAL HIGH (ref 22–32)
pCO2 arterial: 73.2 mmHg (ref 32–48)
pH, Arterial: 7.407 (ref 7.35–7.45)
pO2, Arterial: 103 mmHg (ref 83–108)

## 2021-05-15 LAB — CBC
HCT: 41.4 % (ref 36.0–46.0)
Hemoglobin: 12 g/dL (ref 12.0–15.0)
MCH: 25.5 pg — ABNORMAL LOW (ref 26.0–34.0)
MCHC: 29 g/dL — ABNORMAL LOW (ref 30.0–36.0)
MCV: 87.9 fL (ref 80.0–100.0)
Platelets: 186 10*3/uL (ref 150–400)
RBC: 4.71 MIL/uL (ref 3.87–5.11)
RDW: 16.8 % — ABNORMAL HIGH (ref 11.5–15.5)
WBC: 12.9 10*3/uL — ABNORMAL HIGH (ref 4.0–10.5)
nRBC: 0.3 % — ABNORMAL HIGH (ref 0.0–0.2)

## 2021-05-15 LAB — COMPREHENSIVE METABOLIC PANEL
ALT: 249 U/L — ABNORMAL HIGH (ref 0–44)
AST: 82 U/L — ABNORMAL HIGH (ref 15–41)
Albumin: 3.3 g/dL — ABNORMAL LOW (ref 3.5–5.0)
Alkaline Phosphatase: 58 U/L (ref 38–126)
Anion gap: 9 (ref 5–15)
BUN: 24 mg/dL — ABNORMAL HIGH (ref 6–20)
CO2: 39 mmol/L — ABNORMAL HIGH (ref 22–32)
Calcium: 8.8 mg/dL — ABNORMAL LOW (ref 8.9–10.3)
Chloride: 93 mmol/L — ABNORMAL LOW (ref 98–111)
Creatinine, Ser: 0.83 mg/dL (ref 0.44–1.00)
GFR, Estimated: >60 mL/min (ref >60)
Glucose, Bld: 149 mg/dL — ABNORMAL HIGH (ref 70–99)
Potassium: 4 mmol/L (ref 3.5–5.1)
Sodium: 141 mmol/L (ref 135–145)
Total Bilirubin: 0.7 mg/dL (ref 0.3–1.2)
Total Protein: 6.4 g/dL — ABNORMAL LOW (ref 6.5–8.1)

## 2021-05-15 LAB — PHOSPHORUS: Phosphorus: 2.9 mg/dL (ref 2.5–4.6)

## 2021-05-15 LAB — CULTURE, RESPIRATORY W GRAM STAIN

## 2021-05-15 LAB — MAGNESIUM: Magnesium: 2.3 mg/dL (ref 1.7–2.4)

## 2021-05-15 MED ORDER — STERILE WATER FOR INJECTION IJ SOLN
INTRAMUSCULAR | Status: AC
  Administered 2021-05-15: 5 mL
  Filled 2021-05-15: qty 10

## 2021-05-15 MED ORDER — ACETAZOLAMIDE SODIUM 500 MG IJ SOLR
500.0000 mg | Freq: Once | INTRAMUSCULAR | Status: AC
  Administered 2021-05-15: 500 mg via INTRAVENOUS
  Filled 2021-05-15: qty 500

## 2021-05-15 MED ORDER — LEVOTHYROXINE SODIUM 100 MCG/5ML IV SOLN
75.0000 ug | Freq: Every day | INTRAVENOUS | Status: DC
Start: 2021-05-15 — End: 2021-05-17
  Administered 2021-05-15 – 2021-05-16 (×2): 75 ug via INTRAVENOUS
  Filled 2021-05-15 (×3): qty 5

## 2021-05-15 MED ORDER — FUROSEMIDE 10 MG/ML IJ SOLN
40.0000 mg | Freq: Once | INTRAMUSCULAR | Status: AC
  Administered 2021-05-15: 40 mg via INTRAVENOUS
  Filled 2021-05-15: qty 4

## 2021-05-15 NOTE — Progress Notes (Signed)
Patient removed previous purwick that was placed by this RN earlier this shift. Patient had urinated in the bed and was changed by this RN with the help of the patient for turning. The patient was then asked to lay back on her back so this RN could replace the purwick in order to get an accurate urine output after she received the lasix earlier. The patient states she does not want the purwick in and when asked what she will do when she needs to urinate, since she has incontinence, the patient states "I'll just piss on myself." This RN asked the patient again if the purwick could be replaced, the patient continues to refuse at this time. Will continue to monitor and document I/O's as accurately as possible.  ?

## 2021-05-15 NOTE — Progress Notes (Signed)
PT Cancellation Note ? ?Patient Details ?Name: Gabrielle Carter ?MRN: [id number] ?DOB: May 23, 1975 ? ? ?Cancelled Treatment:    Reason Eval/Treat Not Completed: Medical issues which prohibited therapy ? ?Patient remains on BiPAP, confused and not able to safely mobilize due to decr following commands. Discussed with RN and will re-attempt 3/19 as appropriate. ? ? ?Arby Barrette, PT ?Acute Rehabilitation Services  ?Pager [phone] ?Office [phone] ? ?Gabrielle Carter ?05/15/2021, 3:03 PM ?

## 2021-05-15 NOTE — Progress Notes (Signed)
? ?NAME:  Gabrielle Carter, MRN:  [id number], DOB:  [birth year], LOS: 3 ?ADMISSION DATE:  05/12/2021, CONSULTATION DATE:  05/15/2021 ?REFERRING MD:  [facility], CHIEF COMPLAINT:  respiratory failure  ? ?History of Present Illness:  ?Gabrielle Carter is a 46 y.o. woman with past medical history of bipolar disorder ?schizophrenia with inpatient behavioral health stay, ptsd, hypothyroidism, COPD with ongoing tobacco use disorder.  ? ?History obtained from review of outpatient records from [facility]. She was brought in from home by EMS after being found lethargic at home per mother. Her PO2 was in the 40s and 60s at home per EMS and she was brought in on nonrebreather with a CO2 in the 90s. She was emergently intubated, central line was also placed.  ? ?Labs remarkable noted below: she has transaminitis, TSH 141, leukocytosis with left shift.  ?In the ED at [facility] given 40 mg methylprednisone, 50 mcg synthroid, vancomycin, azithromycin, ceftriaxone, lasix 20 mg as well as normal saline 1L ? ?Bedside echo by EDP showed pericardial effusion. She was transferred to [facility] for ongoing care.  ? ?Pertinent  Medical History  ?COPD. Drug use, PTSD bipolar disorder CHF ? ?Significant Hospital Events: ?Including procedures, antibiotic start and stop dates in addition to other pertinent events   ?3/14 brought in by EMS lethargic, hypercapnic and hypoxemic ?3/14 Intubated, central line placed ?3/15 transferred to [facility] ICU in early am.  ?3/16 extubated ?3/17 placed on BiPAP due to hypercarbia, somnolence ? ?Interim History / Subjective:  ? ?Remains on BiPAP.  ABG slightly better today morning. ? ?Objective   ?Blood pressure 120/85, pulse (!) 45, temperature 98.5 ?F (36.9 ?C), temperature source Axillary, resp. rate 10, height 5\' 8"  (1.727 m), weight 129 kg, SpO2 97 %. ?   ?FiO2 (%):  [40 %-60 %] 50 %  ? ?Intake/Output Summary (Last 24 hours) at 05/15/2021 0749 ?Last data filed at 05/15/2021 0600 ?Gross per 24 hour  ?Intake  290.1 ml  ?Output 1400 ml  ?Net -1109.9 ml  ? ?Filed Weights  ? 05/13/21 0335 05/14/21 0500 05/15/21 0500  ?Weight: 135.6 kg 134.5 kg 129 kg  ? ? ?Examination: ?Gen:      No acute distress, obese ?HEENT:  EOMI, sclera anicteric ?Neck:     No masses; no thyromegaly ?Lungs:    Clear to auscultation bilaterally; normal respiratory effort ?CV:         Regular rate and rhythm; no murmurs ?Abd:      + bowel sounds; soft, non-tender; no palpable masses, no distension ?Ext:    No edema; adequate peripheral perfusion ?Skin:      Warm and dry; no rash ?Neuro: Somnolent ? ?Labs/imaging reviewed ?Significant for glucose of 149, creatinine 0.83 ?AST 82, ALT 249, WBC 12.9 ?Chest x-ray shows cardiomegaly with pulmonary edema ? ?Resolved Hospital Problem list   ?Acute hypoxic respiratory failure requiring mechanical ventilation ?Hyponatremia ?AKI ? 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  Switch to IV as she is on BiPAP.  Increase dose to 75 mcg ?-ACTH stim test is negative but she is on hydrocortisone so results are nonconclusive ?-Repeat TSH in 1 week ? ?Acute metabolic encephalopathy 2/2 myxedema coma ?Possible hypercapnia 2/2 OSA and COPD ?Head CT negative for intracranial hemorrhage or acute process. ?She has waxing and waning mental status. ?She is more somnolent from hypercapnia from OSA and COPD.   ? ?COPD ?Hypercarbic respiratory failure ?-Scheduled DuoNebs ?BiPAP.  Follow ABG ? ?Moderate pericardial effusion ?Questionable HFpEF ?  Pericardial effusion secondary to myxedema coma. EF within normal limits. No evidence of cardiac tamponade.  ?Continues to have bilateral infiltrates suggestive of pulm edema on chest x-ray ?Continue diuresis ? ?Transaminitis ?Underlying cholelithiasis and fatty liver ?Suspect shock liver. LFT improved  ?Monitor hepatic clearance and medication dosing ? ?Bipolar disorder ?Schizophrenia ?Patient has not been taking her medications for a few months.  Confirmed with patient's mother. Will not resume ? ?Best Practice (right click and "Reselect all SmartList Selections" daily)  ? ?Diet/type: soft diet ?DVT prophylaxis: prophylactic heparin  ?GI prophylaxis: H2B ?Lines: NA ?Foley:  NA ?Code Status:  full code ?Last date of multidisciplinary goals of care discussion: updated family 3/16 ? ?Critical care time:   ? ?The patient is critically ill with multiple organ system failure and requires high complexity decision making for assessment and support, frequent evaluation and titration of therapies, advanced monitoring, review of radiographic studies and interpretation of complex data.  ? ?Critical Care Time devoted to patient care services, exclusive of separately billable procedures, described in this note is 35 minutes.  ? ?Chilton Greathouse MD ?[place] [facility] ?See Amion for pager ? ?If no response to pager , please call [phone] until 7pm ?After 7:00 pm call Elink  [phone] ?05/15/2021, 8:02 AM  ? ?

## 2021-05-15 NOTE — Progress Notes (Signed)
Per Dr. Vaughan Browner, okay to take patient off of Bi-PAP and allow for clear liquid diet. MD states to ensure patient goes on Bi-PAP at night for sleeping. ABG ordered for 0800 3/19 per MD request. Will continue to monitor for changes in mental status and respiratory status.  ?

## 2021-05-16 DIAGNOSIS — J9601 Acute respiratory failure with hypoxia: Secondary | ICD-10-CM | POA: Diagnosis not present

## 2021-05-16 DIAGNOSIS — J9602 Acute respiratory failure with hypercapnia: Secondary | ICD-10-CM | POA: Diagnosis not present

## 2021-05-16 LAB — COMPREHENSIVE METABOLIC PANEL
ALT: 188 U/L — ABNORMAL HIGH (ref 0–44)
AST: 58 U/L — ABNORMAL HIGH (ref 15–41)
Albumin: 3.5 g/dL (ref 3.5–5.0)
Alkaline Phosphatase: 60 U/L (ref 38–126)
Anion gap: 11 (ref 5–15)
BUN: 20 mg/dL (ref 6–20)
CO2: 37 mmol/L — ABNORMAL HIGH (ref 22–32)
Calcium: 9.2 mg/dL (ref 8.9–10.3)
Chloride: 89 mmol/L — ABNORMAL LOW (ref 98–111)
Creatinine, Ser: 0.71 mg/dL (ref 0.44–1.00)
GFR, Estimated: >60 mL/min (ref >60)
Glucose, Bld: 138 mg/dL — ABNORMAL HIGH (ref 70–99)
Potassium: 3 mmol/L — ABNORMAL LOW (ref 3.5–5.1)
Sodium: 137 mmol/L (ref 135–145)
Total Bilirubin: 1.1 mg/dL (ref 0.3–1.2)
Total Protein: 6.7 g/dL (ref 6.5–8.1)

## 2021-05-16 LAB — GLUCOSE, CAPILLARY
Glucose-Capillary: 133 mg/dL — ABNORMAL HIGH (ref 70–99)
Glucose-Capillary: 232 mg/dL — ABNORMAL HIGH (ref 70–99)
Glucose-Capillary: 192 mg/dL — ABNORMAL HIGH (ref 70–99)

## 2021-05-16 LAB — CBC
HCT: 42.9 % (ref 36.0–46.0)
Hemoglobin: 12.6 g/dL (ref 12.0–15.0)
MCH: 25.7 pg — ABNORMAL LOW (ref 26.0–34.0)
MCHC: 29.4 g/dL — ABNORMAL LOW (ref 30.0–36.0)
MCV: 87.4 fL (ref 80.0–100.0)
Platelets: 188 10*3/uL (ref 150–400)
RBC: 4.91 MIL/uL (ref 3.87–5.11)
RDW: 16.9 % — ABNORMAL HIGH (ref 11.5–15.5)
WBC: 12.1 10*3/uL — ABNORMAL HIGH (ref 4.0–10.5)
nRBC: 0 % (ref 0.0–0.2)

## 2021-05-16 LAB — POCT I-STAT 7, (LYTES, BLD GAS, ICA,H+H)
Acid-Base Excess: 16 mmol/L — ABNORMAL HIGH (ref 0.0–2.0)
Bicarbonate: 44.6 mmol/L — ABNORMAL HIGH (ref 20.0–28.0)
Calcium, Ion: 1.23 mmol/L (ref 1.15–1.40)
HCT: 45 % (ref 36.0–46.0)
Hemoglobin: 15.3 g/dL — ABNORMAL HIGH (ref 12.0–15.0)
O2 Saturation: 93 %
Patient temperature: 98.6
Potassium: 3.4 mmol/L — ABNORMAL LOW (ref 3.5–5.1)
Sodium: 138 mmol/L (ref 135–145)
TCO2: 47 mmol/L — ABNORMAL HIGH (ref 22–32)
pCO2 arterial: 71.1 mmHg (ref 32–48)
pH, Arterial: 7.406 (ref 7.35–7.45)
pO2, Arterial: 71 mmHg — ABNORMAL LOW (ref 83–108)

## 2021-05-16 MED ORDER — POTASSIUM CHLORIDE 10 MEQ/100ML IV SOLN
10.0000 meq | INTRAVENOUS | Status: AC
  Administered 2021-05-16 (×4): 10 meq via INTRAVENOUS
  Filled 2021-05-16 (×4): qty 100

## 2021-05-16 MED ORDER — FUROSEMIDE 10 MG/ML IJ SOLN
40.0000 mg | Freq: Every day | INTRAMUSCULAR | Status: DC
  Administered 2021-05-16 – 2021-05-21 (×6): 40 mg via INTRAVENOUS
  Filled 2021-05-16 (×6): qty 4

## 2021-05-16 MED ORDER — POTASSIUM CHLORIDE CRYS ER 20 MEQ PO TBCR
20.0000 meq | EXTENDED_RELEASE_TABLET | ORAL | Status: AC
  Administered 2021-05-16 (×2): 20 meq via ORAL
  Filled 2021-05-16 (×2): qty 1

## 2021-05-16 MED ORDER — SODIUM CHLORIDE 0.9 % IV SOLN
1.0000 g | INTRAVENOUS | Status: AC
  Administered 2021-05-16 – 2021-05-20 (×5): 1 g via INTRAVENOUS
  Filled 2021-05-16 (×5): qty 10

## 2021-05-16 MED ORDER — INSULIN ASPART 100 UNIT/ML IJ SOLN
0.0000 [IU] | Freq: Every day | INTRAMUSCULAR | Status: DC
  Administered 2021-05-16: 3 [IU] via SUBCUTANEOUS
  Administered 2021-05-17 – 2021-05-18 (×2): 2 [IU] via SUBCUTANEOUS

## 2021-05-16 MED ORDER — INSULIN ASPART 100 UNIT/ML IJ SOLN
0.0000 [IU] | Freq: Three times a day (TID) | INTRAMUSCULAR | Status: DC
  Administered 2021-05-16 – 2021-05-17 (×2): 3 [IU] via SUBCUTANEOUS

## 2021-05-16 MED ORDER — FAMOTIDINE 20 MG PO TABS
20.0000 mg | ORAL_TABLET | Freq: Every day | ORAL | Status: DC
Start: 2021-05-16 — End: 2021-05-22
  Administered 2021-05-16 – 2021-05-22 (×7): 20 mg via ORAL
  Filled 2021-05-16 (×7): qty 1

## 2021-05-16 NOTE — Progress Notes (Signed)
[facility] ADULT ICU REPLACEMENT PROTOCOL ? ? ?The patient does apply for the [facility] Adult ICU Electrolyte Replacment Protocol based on the criteria listed below:  ? ?1.Exclusion criteria: TCTS patients, ECMO patients, and Dialysis patients ?2. Is GFR >/= 30 ml/min? Yes.    ?Patient's GFR today is > 60 ?3. Is SCr </= 2? Yes.   ?Patient's SCr is 0.71 mg/dL ?4. Did SCr increase >/= 0.5 in 24 hours? No. ?5.Pt's weight >40kg  Yes.   ?6. Abnormal electrolyte(s): K+ 3.0  ?7. Electrolytes replaced per protocol ?8.  Call MD STAT for K+ </= 2.5, Phos </= 1, or Mag </= 1 ?Physician:  Dr Warrick Parisian ? ?Jeri Modena 05/16/2021 4:33 AM  ?

## 2021-05-16 NOTE — Progress Notes (Addendum)
? ?NAME:  Gabrielle Carter, MRN:  [id number], DOB:  [birth year], LOS: 4 ?ADMISSION DATE:  05/12/2021, CONSULTATION DATE:  05/16/2021 ?REFERRING MD:  [facility], CHIEF COMPLAINT:  respiratory failure  ? ?History of Present Illness:  ?Gabrielle Carter is a 46 y.o. woman with past medical history of bipolar disorder ?schizophrenia with inpatient behavioral health stay, ptsd, hypothyroidism, COPD with ongoing tobacco use disorder.  ? ?History obtained from review of outpatient records from [facility]. She was brought in from home by EMS after being found lethargic at home per mother. Her PO2 was in the 40s and 60s at home per EMS and she was brought in on nonrebreather with a CO2 in the 90s. She was emergently intubated, central line was also placed.  ? ?Labs remarkable noted below: she has transaminitis, TSH 141, leukocytosis with left shift.  ?In the ED at [facility] given 40 mg methylprednisone, 50 mcg synthroid, vancomycin, azithromycin, ceftriaxone, lasix 20 mg as well as normal saline 1L ? ?Bedside echo by EDP showed pericardial effusion. She was transferred to [facility] for ongoing care.  ? ?Pertinent  Medical History  ?COPD. Drug use, PTSD bipolar disorder CHF ? ?Significant Hospital Events: ?Including procedures, antibiotic start and stop dates in addition to other pertinent events   ?3/14 brought in by EMS lethargic, hypercapnic and hypoxemic ?3/14 Intubated, central line placed ?3/15 transferred to [facility] ICU in early am.  ?3/16 extubated ?3/17 placed on BiPAP due to hypercarbia, somnolence ?3/18 Off Bipap ? ?Interim History / Subjective:  ? ?Off BiPAP yesterday and used intermittently overnight.  She is much more awake and asking to eat ?She has gotten out of bed to the chair without any issue ? ?Objective   ?Blood pressure 125/82, pulse 69, temperature 98.6 ?F (37 ?C), temperature source Oral, resp. rate 18, height 5\' 8"  (1.727 m), weight 129 kg, SpO2 95 %. ?   ?FiO2 (%):  [40 %-50 %] 40 %  ? ?Intake/Output  Summary (Last 24 hours) at 05/16/2021 0910 ?Last data filed at 05/16/2021 0600 ?Gross per 24 hour  ?Intake 924 ml  ?Output 250 ml  ?Net 674 ml  ? ?Filed Weights  ? 05/13/21 0335 05/14/21 0500 05/15/21 0500  ?Weight: 135.6 kg 134.5 kg 129 kg  ? ? ?Examination: ?Gen:      No acute distress ?HEENT:  EOMI, sclera anicteric ?Neck:     No masses; no thyromegaly ?Lungs:    Scattered wheeze ?CV:         Regular rate and rhythm; no murmurs ?Abd:      + bowel sounds; soft, non-tender; no palpable masses, no distension ?Ext:    No edema; adequate peripheral perfusion ?Skin:      Warm and dry; no rash ?Neuro: alert and oriented x 3 ? ? ?Labs/imaging reviewed ?pH 7.41, PCO2 71, PO2 71 ?Potassium 3, AST 58, ALT 188 ? ? ?Resolved Hospital Problem list   ?Acute hypoxic respiratory failure requiring mechanical ventilation ?Hyponatremia ?AKI ? 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  Switch to IV as she is on BiPAP.  Increased dose to 75 mcg on 3/18 ?-ACTH stim test is negative but she is on hydrocortisone so results are nonconclusive ?-Repeat TSH in 1 week ? ?Acute metabolic encephalopathy 2/2 myxedema coma ?Possible hypercapnia 2/2 OSA and COPD ?Head CT negative for intracranial hemorrhage or acute process. ?She has waxing and waning mental status. ?She is more somnolent from hypercapnia from OSA and COPD.   ? ?  COPD ?Hypercarbic respiratory failure ?Pneumococcus pneumonia.   ?Tracheal aspirate is now growing pneumococcus.  Start ceftriaxone ?ABG shows improving PCO2 which is compensated ?Use BiPAP as needed and mandatory during the night ?No need for routine ABG follow-up unless mental status changes ?Continue nebs, steroids as above ? ?Moderate pericardial effusion ?Questionable HFpEF ?Pericardial effusion secondary to myxedema coma. EF within normal limits. No evidence of cardiac tamponade.  ?Continues to have bilateral infiltrates suggestive of pulm edema on chest x-ray ?Continue  diuresis ? ?Transaminitis ?Underlying cholelithiasis and fatty liver ?Suspect shock liver. LFT are improving ?Monitor hepatic clearance and medication dosing ? ?Bipolar disorder ?Schizophrenia ?Patient has not been taking her medications for a few months. Confirmed with patient's mother. Will not resume ? ?Stable for transfer out of ICU and to [facility]. PT ordered ? ?Best Practice (right click and "Reselect all SmartList Selections" daily)  ? ?Diet/type: soft diet ?DVT prophylaxis: prophylactic heparin  ?GI prophylaxis: H2B ?Lines: NA ?Foley:  NA ?Code Status:  full code ?Last date of multidisciplinary goals of care discussion: Updated mom over telephone on 3/19 ? ?Critical care time: NA  ? ?Chilton Greathouse MD ?[place] [facility] ?See Amion for pager ? ?If no response to pager , please call [phone] until 7pm ?After 7:00 pm call Elink  [phone] ?05/16/2021, 9:25 AM  ?

## 2021-05-17 ENCOUNTER — Inpatient Hospital Stay: Payer: Self-pay

## 2021-05-17 ENCOUNTER — Encounter (HOSPITAL_COMMUNITY): Payer: Self-pay | Admitting: Internal Medicine

## 2021-05-17 ENCOUNTER — Other Ambulatory Visit: Payer: Self-pay

## 2021-05-17 ENCOUNTER — Inpatient Hospital Stay (HOSPITAL_COMMUNITY): Payer: Medicaid Other

## 2021-05-17 DIAGNOSIS — J9601 Acute respiratory failure with hypoxia: Secondary | ICD-10-CM | POA: Diagnosis not present

## 2021-05-17 DIAGNOSIS — J189 Pneumonia, unspecified organism: Secondary | ICD-10-CM | POA: Diagnosis not present

## 2021-05-17 DIAGNOSIS — E035 Myxedema coma: Secondary | ICD-10-CM | POA: Diagnosis not present

## 2021-05-17 DIAGNOSIS — I5033 Acute on chronic diastolic (congestive) heart failure: Secondary | ICD-10-CM | POA: Diagnosis not present

## 2021-05-17 LAB — CBC
HCT: 43 % (ref 36.0–46.0)
Hemoglobin: 12.9 g/dL (ref 12.0–15.0)
MCH: 25.9 pg — ABNORMAL LOW (ref 26.0–34.0)
MCHC: 30 g/dL (ref 30.0–36.0)
MCV: 86.3 fL (ref 80.0–100.0)
Platelets: 200 10*3/uL (ref 150–400)
RBC: 4.98 MIL/uL (ref 3.87–5.11)
RDW: 16.7 % — ABNORMAL HIGH (ref 11.5–15.5)
WBC: 12.2 10*3/uL — ABNORMAL HIGH (ref 4.0–10.5)
nRBC: 0 % (ref 0.0–0.2)

## 2021-05-17 LAB — GLUCOSE, CAPILLARY
Glucose-Capillary: 253 mg/dL — ABNORMAL HIGH (ref 70–99)
Glucose-Capillary: 245 mg/dL — ABNORMAL HIGH (ref 70–99)
Glucose-Capillary: 274 mg/dL — ABNORMAL HIGH (ref 70–99)
Glucose-Capillary: 189 mg/dL — ABNORMAL HIGH (ref 70–99)
Glucose-Capillary: 239 mg/dL — ABNORMAL HIGH (ref 70–99)

## 2021-05-17 LAB — COMPREHENSIVE METABOLIC PANEL
ALT: 131 U/L — ABNORMAL HIGH (ref 0–44)
AST: 34 U/L (ref 15–41)
Albumin: 3.1 g/dL — ABNORMAL LOW (ref 3.5–5.0)
Alkaline Phosphatase: 79 U/L (ref 38–126)
Anion gap: 9 (ref 5–15)
BUN: 22 mg/dL — ABNORMAL HIGH (ref 6–20)
CO2: 40 mmol/L — ABNORMAL HIGH (ref 22–32)
Calcium: 8.9 mg/dL (ref 8.9–10.3)
Chloride: 87 mmol/L — ABNORMAL LOW (ref 98–111)
Creatinine, Ser: 0.76 mg/dL (ref 0.44–1.00)
GFR, Estimated: >60 mL/min (ref >60)
Glucose, Bld: 211 mg/dL — ABNORMAL HIGH (ref 70–99)
Potassium: 2.9 mmol/L — ABNORMAL LOW (ref 3.5–5.1)
Sodium: 136 mmol/L (ref 135–145)
Total Bilirubin: 0.6 mg/dL (ref 0.3–1.2)
Total Protein: 6.2 g/dL — ABNORMAL LOW (ref 6.5–8.1)

## 2021-05-17 LAB — MAGNESIUM: Magnesium: 2 mg/dL (ref 1.7–2.4)

## 2021-05-17 LAB — PHOSPHORUS: Phosphorus: 3 mg/dL (ref 2.5–4.6)

## 2021-05-17 MED ORDER — PNEUMOCOCCAL 20-VAL CONJ VACC 0.5 ML IM SUSY
0.5000 mL | PREFILLED_SYRINGE | INTRAMUSCULAR | Status: AC
  Administered 2021-05-18: 0.5 mL via INTRAMUSCULAR
  Filled 2021-05-17: qty 0.5

## 2021-05-17 MED ORDER — SODIUM CHLORIDE 0.9% FLUSH: 10.0000 mL | INTRAVENOUS | Status: DC | PRN

## 2021-05-17 MED ORDER — ACETAZOLAMIDE 250 MG PO TABS
250.0000 mg | ORAL_TABLET | Freq: Two times a day (BID) | ORAL | Status: DC
  Administered 2021-05-17 – 2021-05-18 (×3): 250 mg via ORAL
  Filled 2021-05-17 (×4): qty 1

## 2021-05-17 MED ORDER — INFLUENZA VAC SPLIT QUAD 0.5 ML IM SUSY
0.5000 mL | PREFILLED_SYRINGE | INTRAMUSCULAR | Status: AC
  Administered 2021-05-18: 0.5 mL via INTRAMUSCULAR
  Filled 2021-05-17: qty 0.5

## 2021-05-17 MED ORDER — LEVOTHYROXINE SODIUM 75 MCG PO TABS
150.0000 ug | ORAL_TABLET | Freq: Every day | ORAL | Status: DC
  Administered 2021-05-17 – 2021-05-22 (×6): 150 ug via ORAL
  Filled 2021-05-17 (×6): qty 2

## 2021-05-17 MED ORDER — POTASSIUM CHLORIDE 10 MEQ/100ML IV SOLN
10.0000 meq | INTRAVENOUS | Status: AC
  Administered 2021-05-17 (×2): 10 meq via INTRAVENOUS
  Filled 2021-05-17 (×4): qty 100

## 2021-05-17 MED ORDER — IPRATROPIUM-ALBUTEROL 0.5-2.5 (3) MG/3ML IN SOLN: 3.0000 mL | Freq: Four times a day (QID) | RESPIRATORY_TRACT | Status: DC | PRN

## 2021-05-17 MED ORDER — RISPERIDONE 0.5 MG PO TABS
1.0000 mg | ORAL_TABLET | Freq: Every day | ORAL | Status: DC
  Administered 2021-05-17 – 2021-05-21 (×5): 1 mg via ORAL
  Filled 2021-05-17 (×5): qty 2

## 2021-05-17 MED ORDER — LIDOCAINE 5 % EX PTCH
1.0000 | MEDICATED_PATCH | CUTANEOUS | Status: DC
  Administered 2021-05-17 – 2021-05-21 (×4): 1 via TRANSDERMAL
  Filled 2021-05-17 (×4): qty 1

## 2021-05-17 MED ORDER — INSULIN ASPART 100 UNIT/ML IJ SOLN
0.0000 [IU] | Freq: Three times a day (TID) | INTRAMUSCULAR | Status: DC
  Administered 2021-05-17: 8 [IU] via SUBCUTANEOUS
  Administered 2021-05-17 – 2021-05-18 (×3): 3 [IU] via SUBCUTANEOUS
  Administered 2021-05-18: 5 [IU] via SUBCUTANEOUS
  Administered 2021-05-19: 8 [IU] via SUBCUTANEOUS
  Administered 2021-05-19: 2 [IU] via SUBCUTANEOUS
  Administered 2021-05-19: 3 [IU] via SUBCUTANEOUS
  Administered 2021-05-20: 2 [IU] via SUBCUTANEOUS
  Administered 2021-05-20 (×2): 3 [IU] via SUBCUTANEOUS
  Administered 2021-05-21: 2 [IU] via SUBCUTANEOUS
  Administered 2021-05-21: 3 [IU] via SUBCUTANEOUS
  Administered 2021-05-21: 2 [IU] via SUBCUTANEOUS
  Administered 2021-05-22: 3 [IU] via SUBCUTANEOUS

## 2021-05-17 MED ORDER — ARFORMOTEROL TARTRATE 15 MCG/2ML IN NEBU
15.0000 ug | INHALATION_SOLUTION | Freq: Two times a day (BID) | RESPIRATORY_TRACT | Status: DC
  Administered 2021-05-17 – 2021-05-22 (×10): 15 ug via RESPIRATORY_TRACT
  Filled 2021-05-17 (×13): qty 2

## 2021-05-17 MED ORDER — HYDROCORTISONE SOD SUC (PF) 100 MG IJ SOLR
50.0000 mg | Freq: Three times a day (TID) | INTRAMUSCULAR | Status: DC
  Filled 2021-05-17: qty 2

## 2021-05-17 MED ORDER — SODIUM CHLORIDE 0.9% FLUSH
10.0000 mL | Freq: Two times a day (BID) | INTRAVENOUS | Status: DC
  Administered 2021-05-17: 10 mL
  Administered 2021-05-18: 20 mL
  Administered 2021-05-18 – 2021-05-21 (×7): 10 mL

## 2021-05-17 MED ORDER — TRAZODONE HCL 50 MG PO TABS
50.0000 mg | ORAL_TABLET | Freq: Every day | ORAL | Status: DC
  Administered 2021-05-17 – 2021-05-21 (×5): 50 mg via ORAL
  Filled 2021-05-17 (×5): qty 1

## 2021-05-17 MED ORDER — HYDROCORTISONE SOD SUC (PF) 100 MG IJ SOLR
75.0000 mg | Freq: Two times a day (BID) | INTRAMUSCULAR | Status: AC
  Administered 2021-05-17 – 2021-05-18 (×3): 75 mg via INTRAVENOUS
  Filled 2021-05-17 (×2): qty 2

## 2021-05-17 MED ORDER — BUDESONIDE 0.25 MG/2ML IN SUSP
0.2500 mg | Freq: Two times a day (BID) | RESPIRATORY_TRACT | Status: DC
  Administered 2021-05-17 – 2021-05-22 (×10): 0.25 mg via RESPIRATORY_TRACT
  Filled 2021-05-17 (×11): qty 2

## 2021-05-17 MED ORDER — POTASSIUM CHLORIDE CRYS ER 20 MEQ PO TBCR
20.0000 meq | EXTENDED_RELEASE_TABLET | Freq: Every day | ORAL | Status: DC
  Administered 2021-05-17: 20 meq via ORAL
  Filled 2021-05-17: qty 1

## 2021-05-17 MED ORDER — POTASSIUM CHLORIDE CRYS ER 20 MEQ PO TBCR
40.0000 meq | EXTENDED_RELEASE_TABLET | Freq: Once | ORAL | Status: AC
  Administered 2021-05-17: 40 meq via ORAL
  Filled 2021-05-17: qty 2

## 2021-05-17 NOTE — Progress Notes (Signed)
?  Progress Note  ? ?Date: 05/15/2021 ? ?Patient Name: Gabrielle Carter        ?MRN#: [id number] ? ? ?Clarification of diagnosis: ? ? ?morbid obesity ? ? ? ? ?

## 2021-05-17 NOTE — Evaluation (Addendum)
Addendum: ?Given pt's insurance she is unable to receive HHPT, as such as long as she has reliable, safe transportation PT will recommend Outpatient PT.  ?Denetta Fei B. Migdalia Dk PT, DPT ?Acute Rehabilitation Services ?Pager [phone] ?Office [phone] ? ?Physical Therapy Evaluation ?Patient Details ?Name: Gabrielle Carter ?MRN: [id number] ?DOB: [birth year] ?Today's Date: 05/17/2021 ? ?History of Present Illness ? 46 y.o. woman brought to [facility] via EMS when her mother found her lethargic at home. SpO2 40-60s in field, placed on NRB. Intubated and central line placed 05/11/21. Transferred to [facility] ICU 05/12/21 for treatment of myxedema Coma, acute hypoxemic and hypercapnic respiratory failur and acute decomposition of heart failure with moderate to large pericardial effusion. Extubated 3/16 placed on biPAP due to hypercarbia and somnolence, off biPAP 3/18   PMH: bipolar disorder ?schizophrenia with inpatient behavioral health stay, ptsd, hypothyroidism, COPD with ongoing tobacco use disorder.  ?Clinical Impression ? PTA pt reports living with mother and step-father in single story apartment with level entry. Pt reports independence in mobility and ADLs. Pt very flat with limited response to questions and is limited in mobility by decreased safety awareness and decreased familiarity with DME in presence of increased O2 demand, mild instability and generalized weakness. Pt is min guard for transfers and light min A for steadying with ambulation using Rollator. PT recommending HHPT at discharge to improve strength and mobility. PT will continue to follow acutely.    ?   ? ?Recommendations for follow up therapy are one component of a multi-disciplinary discharge planning process, led by the attending physician.  Recommendations may be updated based on patient status, additional functional criteria and insurance authorization. ? ?Follow Up Recommendations Home health PT ? ?  ?Assistance Recommended at  Discharge Frequent or constant Supervision/Assistance  ?Patient can return home with the following ? A little help with walking and/or transfers;A lot of help with bathing/dressing/bathroom;Direct supervision/assist for medications management;Direct supervision/assist for financial management;Assist for transportation ? ?  ?Equipment Recommendations Rollator (4 wheels);BSC/3in1  ?   ?Functional Status Assessment Patient has had a recent decline in their functional status and demonstrates the ability to make significant improvements in function in a reasonable and predictable amount of time.  ? ?  ?Precautions / Restrictions Precautions ?Precautions: Fall ?Restrictions ?Weight Bearing Restrictions: No  ? ?  ? ?Mobility ? Bed Mobility ?  ?  ?  ?  ?  ?  ?  ?General bed mobility comments: sitting EoB on entry ?  ? ?Transfers ?Overall transfer level: Needs assistance ?  ?Transfers: Sit to/from Stand ?Sit to Stand: Min guard ?  ?  ?  ?  ?  ?General transfer comment: min guard for safety ?  ? ?Ambulation/Gait ?Ambulation/Gait assistance: Min assist ?Gait Distance (Feet): 150 Feet ?Assistive device: Rollator (4 wheels) ?Gait Pattern/deviations: Step-through pattern, Decreased step length - right, Decreased step length - left, Shuffle ?Gait velocity: slowed ?Gait velocity interpretation: <1.8 ft/sec, indicate of risk for recurrent falls ?  ?General Gait Details: contact guard assist for slowed, shuffling gait, mildly unsteady, no overt LoB ? ?  ? ?  ? ?Balance Overall balance assessment: Mild deficits observed, not formally tested ?  ?  ?  ?  ?  ?  ?  ?  ?  ?  ?  ?  ?  ?  ?  ?  ?  ?  ?   ? ? ? ?Pertinent Vitals/Pain Pain Assessment ?Pain Assessment: No/denies pain  ? ? ?Home Living Family/patient expects  to be discharged to:: Private residence ?Living Arrangements: Parent ?Available Help at Discharge: Family;Available 24 hours/day ?Type of Home: Apartment ?Home Access: Level entry ?  ?  ?  ?Home Layout: One level ?Home  Equipment: Grab bars - tub/shower;Hand held shower head;Tub bench ?   ?  ?Prior Function Prior Level of Function : Independent/Modified Independent ?  ?  ?  ?  ?  ?  ?  ?  ?  ? ? ?   ?Extremity/Trunk Assessment  ? Upper Extremity Assessment ?Upper Extremity Assessment: Generalized weakness ?  ? ?Lower Extremity Assessment ?Lower Extremity Assessment: Generalized weakness ?  ? ?   ?Communication  ? Communication: No difficulties  ?Cognition Arousal/Alertness: Awake/alert ?Behavior During Therapy: Flat affect ?Overall Cognitive Status: No family/caregiver present to determine baseline cognitive functioning ?  ?  ?  ?  ?  ?  ?  ?  ?  ?  ?  ?  ?  ?  ?  ?  ?General Comments: very flat requiring increased verbal cues for Rollator use ?  ?  ? ?  ?General Comments General comments (skin integrity, edema, etc.): Pt on 6 L O2 via [place], maintains SpO2 >88%O2 with ambulation, returns to 93%O2 with sitting in recliner, HR 72-89bpm ? ?  ?   ? ?Assessment/Plan  ?  ?PT Assessment Patient needs continued PT services  ?PT Problem List Decreased strength;Decreased activity tolerance;Decreased balance;Decreased mobility;Cardiopulmonary status limiting activity;Decreased safety awareness;Decreased knowledge of use of DME ? ?   ?  ?PT Treatment Interventions DME instruction;Gait training;Functional mobility training;Therapeutic activities;Therapeutic exercise;Balance training;Cognitive remediation;Patient/family education   ? ?PT Goals (Current goals can be found in the Care Plan section)  ?Acute Rehab PT Goals ?Patient Stated Goal: go home ?PT Goal Formulation: With patient ?Time For Goal Achievement: 05/31/21 ?Potential to Achieve Goals: Good ? ?  ?Frequency Min 3X/week ?  ? ? ?   ?AM-PAC PT "6 Clicks" Mobility  ?Outcome Measure Help needed turning from your back to your side while in a flat bed without using bedrails?: None ?Help needed moving from lying on your back to sitting on the side of a flat bed without using bedrails?:  None ?Help needed moving to and from a bed to a chair (including a wheelchair)?: A Little ?Help needed standing up from a chair using your arms (e.g., wheelchair or bedside chair)?: A Little ?Help needed to walk in hospital room?: A Little ?Help needed climbing 3-5 steps with a railing? : A Lot ?6 Click Score: 19 ? ?  ?End of Session Equipment Utilized During Treatment: Gait belt;Oxygen ?Activity Tolerance: Patient tolerated treatment well ?Patient left: in chair;with call bell/phone within reach;with chair alarm set ?Nurse Communication: Mobility status ?PT Visit Diagnosis: Muscle weakness (generalized) (M62.81);Difficulty in walking, not elsewhere classified (R26.2) ?  ? ?Time: [id number] ?PT Time Calculation (min) (ACUTE ONLY): 18 min ? ? ?Charges:   PT Evaluation ?$PT Eval Moderate Complexity: 1 Mod ?  ?  ?   ? ? ?Laguana Desautel B. Migdalia Dk PT, DPT ?Acute Rehabilitation Services ?Pager [phone] ?Office [phone] ? ? ?[place] ?05/17/2021, 11:34 AM ? ?

## 2021-05-17 NOTE — Progress Notes (Signed)
[facility] ADULT ICU REPLACEMENT PROTOCOL ? ? ?The patient does apply for the [facility] Adult ICU Electrolyte Replacment Protocol based on the criteria listed below:  ? ?1.Exclusion criteria: TCTS patients, ECMO patients, and Dialysis patients ?2. Is GFR >/= 30 ml/min? Yes.    ?Patient's GFR today is > 60 ?3. Is SCr </= 2? Yes.   ?Patient's SCr is 0.76 mg/dL ?4. Did SCr increase >/= 0.5 in 24 hours? No. ?5.Pt's weight >40kg  Yes.   ?6. Abnormal electrolyte(s): K+ 2.9  ?7. Electrolytes replaced per protocol ?8.  Call MD STAT for K+ </= 2.5, Phos </= 1, or Mag </= 1 ?Physician:  Dr Vladimir Faster ? ?Jeri Modena 05/17/2021 6:16 AM  ?

## 2021-05-17 NOTE — Progress Notes (Addendum)
PROGRESS NOTE        PATIENT DETAILS Name: Gabrielle Carter Age: 46 y.o. Sex: female Date of Birth: 09/25/1975 Admit Date: 05/12/2021 Admitting Physician Charlott Holler, MD UEA:[id number], No Pcp Per (Inactive)  Brief Summary: Patient is a 46 y.o.  female with history of bipolar disorder, hypothyroidism, COPD, OSA-who was brought to [facility] with lethargy-she was found to have acute hypercarbic respiratory failure with CO2 in the 90s-patient was emergently intubated and subsequently transferred to [facility] ICU.  She was also found to have a TSH of 141.  See below for further details.   Significant events: 3/14>> brought to [facility] health ED by EMS with hypercapnia/hypoxemic-lethargic.  Intubated.  TSH 141 at [facility]. 3/15>> transferred to [facility] ICU. 3/16>> extubated 3/17>> placed on BiPAP due to somnolence/hypercarbia 3/18>> off BiPAP 3/20>> transfer to [facility]  Significant studies: 3/14>> TSH 141 3/14>> CT head (at [facility]): No acute intracranial process 3/14>> CT chest (at [facility]): Moderate to large pericardial effusion, multifocal groundglass opacities/consolidation in the lungs bilaterally.   3/15>> CXR: No obvious PNA-ET tube in place. 3/15>> Echo: EF 50-55%.  Mild reduction in RV systolic function.  Moderate pericardial effusion-no tamponade. 3/15>> TSH 95.9 3/18>> CXR: Opacity in the medial right lung-?  Developing PNA.  Significant microbiology data: 3/15>> tracheal aspirate: Streptococcus PNA  Procedures: 3/14-3/16>> ETT  Consults: PCCM  Subjective: Refused to be placed on BiPAP last night-inquiring when she can go home.  Claims she has not taken her psych medication in months.  Acknowledges not being compliant to levothyroxine.  Claims she lives with her mother.  Objective: Vitals: Blood pressure (!) 146/78, pulse 76, temperature 99.2 F (37.3 C), temperature source Axillary, resp. rate (!) 23, height 5\' 8"   (1.727 m), weight 130 kg, SpO2 96 %.   Exam: Gen Exam:Alert awake-not in any distress HEENT:atraumatic, normocephalic Chest: B/L clear to auscultation anteriorly CVS:S1S2 regular Abdomen:soft non tender, non distended Extremities:no edema Neurology: Non focal Skin: no rash  Pertinent Labs/Radiology: CBC Latest Ref Rng & Units 05/17/2021 05/16/2021 05/16/2021  WBC 4.0 - 10.5 K/uL 12.2(H) - 12.1(H)  Hemoglobin 12.0 - 15.0 g/dL 91.4 15.3(H) 12.6  Hematocrit 36.0 - 46.0 % 43.0 45.0 42.9  Platelets 150 - 400 K/uL 200 - 188    Lab Results  Component Value Date   NA 136 05/17/2021   K 2.9 (L) 05/17/2021   CL 87 (L) 05/17/2021   CO2 40 (H) 05/17/2021      Assessment/Plan: Acute hypercarbic/hypoxemic respiratory failure: Multifactorial due to myxedema coma/OSA/possible PNA and HFpEF exacerbation. Intubated on initial presentation to [facility] health-subsequently extubated on 3/16.  Unfortunately noncompliant to BiPAP-and is on 6 L of oxygen.  Remains on diuretics and antibiotics (see below).  May need to be assessed for home O2 requirement prior to discharge.  Myxedema coma: Likely due to nonadherence to levothyroxine-she is completely awake and alert-we will switch to oral levothyroxine 150 mcg orally (was on 75 mcg IV).  Start tapering down hydrocortisone.  We will plan to repeat TSH in 1 week.  Note-ACTH stim test on 3/17 unreliable as this was done when patient was on IV hydrocortisone.  Steroid-induced hyperglycemia: Continue SSI-steroids being tapered down-anticipate CBGs to improve over the next few days.  Check A1c with a.m. labs.  Acute metabolic encephalopathy: Due to hypercarbia/myxedema coma-resolved.  Pneumococcal pneumonia: Continue IV Rocephin-we will  plan on at least 5-7 days of antimicrobial therapy.  HFpEF exacerbation: Continue furosemide-we will add acetazolamide given significantly elevated serum bicarb  Moderate pericardial effusion: Likely due to myxedema-no  tamponade physiology evident on echo.  Plan is to repeat echocardiogram in 6 to 8 weeks.  Hypokalemia: Likely due to furosemide use-being repleted IV-we will add 20 mEq daily dosing.  Transaminitis: Mild-downtrending-unclear etiology-follow for now.  Acute hepatitis serology was negative.  COPD: Not in exacerbation-continue bronchodilators.  OSA: Unfortunately noncompliant to CPAP at home-and even in the hospital.  Have counseled extensively today-if she consents-use BiPAP nightly and when she sleeps.  Interestingly-mother claims that she does not have a CPAP device at home.  Bipolar disorder: Patient interested in resuming her psych meds-apparently she has not been compliant with these medications for the past several months-have consulted psychiatry.  Nutrition Status: Nutrition Problem: Inadequate oral intake Etiology: inability to eat Signs/Symptoms: NPO status Interventions: Tube feeding  Morbid obesity: Estimated body mass index is 43.58 kg/m as calculated from the following:   Height as of this encounter: 5\' 8"  (1.727 m).   Weight as of this encounter: 130 kg.   Code status:   Code Status: Full Code   DVT Prophylaxis: heparin injection 5,000 Units Start: 05/12/21 1400 SCDs Start: 05/12/21 0129   Family Communication: Mother-Juanita [phone] updated over the phone.   Disposition Plan: Status is: Inpatient Remains inpatient appropriate because: Still on 6 L of oxygen-being diuresed-on IV antibiotics-not yet stable for discharge.  Suspect requires another few more days of hospitalization.   Planned Discharge Destination:Home health vs SNF   Diet: Diet Order             Diet Heart Room service appropriate? Yes; Fluid consistency: Thin  Diet effective now                     Antimicrobial agents: Anti-infectives (From admission, onward)    Start     Dose/Rate Route Frequency Ordered Stop   05/16/21 1015  cefTRIAXone (ROCEPHIN) 1 g in sodium  chloride 0.9 % 100 mL IVPB        1 g 200 mL/hr over 30 Minutes Intravenous Every 24 hours 05/16/21 0922     05/12/21 1800  cefTRIAXone (ROCEPHIN) 1 g in sodium chloride 0.9 % 100 mL IVPB        1 g 200 mL/hr over 30 Minutes Intravenous Every 24 hours 05/12/21 0230 05/12/21 1802   05/12/21 1800  azithromycin (ZITHROMAX) 250 mg in dextrose 5 % 125 mL IVPB        250 mg 127.5 mL/hr over 60 Minutes Intravenous Every 24 hours 05/12/21 0230 05/12/21 1923        MEDICATIONS: Scheduled Meds:  chlorhexidine  15 mL Mouth Rinse BID   Chlorhexidine Gluconate Cloth  6 each Topical Daily   famotidine  20 mg Oral Daily   feeding supplement  237 mL Oral BID BM   furosemide  40 mg Intravenous Daily   heparin  5,000 Units Subcutaneous Q8H   hydrocortisone sod succinate (SOLU-CORTEF) inj  50 mg Intravenous Q8H   insulin aspart  0-15 Units Subcutaneous TID WC   insulin aspart  0-5 Units Subcutaneous QHS   ipratropium-albuterol  3 mL Nebulization QID   levothyroxine  150 mcg Oral Q0600   mouth rinse  15 mL Mouth Rinse q12n4p   multivitamin with minerals  1 tablet Oral Daily   sodium chloride flush  10-40 mL Intracatheter Q12H   Continuous  Infusions:  sodium chloride Stopped (05/17/21 0641)   cefTRIAXone (ROCEPHIN)  IV 1 g (05/17/21 0907)   potassium chloride 10 mEq (05/17/21 0851)   PRN Meds:.albuterol, docusate, polyethylene glycol, sodium chloride flush   I have personally reviewed following labs and imaging studies  LABORATORY DATA: CBC: Recent Labs  Lab 05/13/21 0505 05/14/21 0548 05/14/21 0802 05/15/21 0031 05/15/21 0748 05/16/21 0256 05/16/21 0911 05/17/21 0423  WBC 15.2* 15.4*  --  12.9*  --  12.1*  --  12.2*  HGB 12.3 12.1   < > 12.0 14.3 12.6 15.3* 12.9  HCT 40.5 40.9   < > 41.4 42.0 42.9 45.0 43.0  MCV 83.7 87.4  --  87.9  --  87.4  --  86.3  PLT 189 201  --  186  --  188  --  200   < > = values in this interval not displayed.    Basic Metabolic Panel: Recent Labs   Lab 05/12/21 0231 05/12/21 1844 05/13/21 0505 05/14/21 5784 05/14/21 0802 05/15/21 0031 05/15/21 6962 05/16/21 0256 05/16/21 0911 05/17/21 0423  NA 131*  --  133* 139   < > 141 140 137 138 136  K 3.9  --  3.8 4.1   < > 4.0 3.8 3.0* 3.4* 2.9*  CL 88*  --  89* 92*  --  93*  --  89*  --  87*  CO2 31  --  34* 36*  --  39*  --  37*  --  40*  GLUCOSE 135*  --  148* 179*  --  149*  --  138*  --  211*  BUN 35*  --  40* 35*  --  24*  --  20  --  22*  CREATININE 1.86*  --  1.54* 1.17*  --  0.83  --  0.71  --  0.76  CALCIUM 8.2*  --  8.5* 8.6*  --  8.8*  --  9.2  --  8.9  MG 2.1 2.3 2.4  --   --  2.3  --   --   --  2.0  PHOS 2.5 4.0 6.0*  --   --  2.9  --   --   --  3.0   < > = values in this interval not displayed.    GFR: Estimated Creatinine Clearance: 125.3 mL/min (by C-G formula based on SCr of 0.76 mg/dL).  Liver Function Tests: Recent Labs  Lab 05/13/21 0505 05/14/21 0548 05/15/21 0031 05/16/21 0256 05/17/21 0423  AST 384* 145* 82* 58* 34  ALT 536* 346* 249* 188* 131*  ALKPHOS 75 75 58 60 79  BILITOT 0.6 0.6 0.7 1.1 0.6  PROT 6.7 6.5 6.4* 6.7 6.2*  ALBUMIN 3.4* 3.4* 3.3* 3.5 3.1*   No results for input(s): LIPASE, AMYLASE in the last 168 hours. No results for input(s): AMMONIA in the last 168 hours.  Coagulation Profile: No results for input(s): INR, PROTIME in the last 168 hours.  Cardiac Enzymes: No results for input(s): CKTOTAL, CKMB, CKMBINDEX, TROPONINI in the last 168 hours.  BNP (last 3 results) No results for input(s): PROBNP in the last 8760 hours.  Lipid Profile: No results for input(s): CHOL, HDL, LDLCALC, TRIG, CHOLHDL, LDLDIRECT in the last 72 hours.  Thyroid Function Tests: No results for input(s): TSH, T4TOTAL, FREET4, T3FREE, THYROIDAB in the last 72 hours.  Anemia Panel: No results for input(s): VITAMINB12, FOLATE, FERRITIN, TIBC, IRON, RETICCTPCT in the last 72 hours.  Urine analysis:  Component Value Date/Time   COLORURINE YELLOW  05/12/2021 1128   APPEARANCEUR CLEAR 05/12/2021 1128   LABSPEC 1.009 05/12/2021 1128   PHURINE 5.0 05/12/2021 1128   GLUCOSEU NEGATIVE 05/12/2021 1128   HGBUR NEGATIVE 05/12/2021 1128   BILIRUBINUR NEGATIVE 05/12/2021 1128   KETONESUR NEGATIVE 05/12/2021 1128   PROTEINUR NEGATIVE 05/12/2021 1128   NITRITE NEGATIVE 05/12/2021 1128   LEUKOCYTESUR SMALL (A) 05/12/2021 1128    Sepsis Labs: Lactic Acid, Venous No results found for: LATICACIDVEN  MICROBIOLOGY: Recent Results (from the past 240 hour(s))  MRSA Next Gen by PCR, Nasal     Status: None   Collection Time: 05/12/21  1:33 AM   Specimen: Nasal Mucosa; Nasal Swab  Result Value Ref Range Status   MRSA by PCR Next Gen NOT DETECTED NOT DETECTED Final    Comment: (NOTE) The GeneXpert MRSA Assay (FDA approved for NASAL specimens only), is one component of a comprehensive MRSA colonization surveillance program. It is not intended to diagnose MRSA infection nor to guide or monitor treatment for MRSA infections. Test performance is not FDA approved in patients less than 67 years old. Performed at [facility], [address], [place], [place] [address]   Culture, Respiratory w Gram Stain     Status: None   Collection Time: 05/12/21 11:37 AM   Specimen: Tracheal Aspirate; Respiratory  Result Value Ref Range Status   Specimen Description TRACHEAL ASPIRATE  Final   Special Requests NONE  Final   Gram Stain   Final    RARE WBC PRESENT, PREDOMINANTLY PMN NO ORGANISMS SEEN Performed at [facility], [address], [place], [place] [address]    Culture RARE STREPTOCOCCUS PNEUMONIAE  Final   Report Status 05/15/2021 FINAL  Final   Organism ID, Bacteria STREPTOCOCCUS PNEUMONIAE  Final      Susceptibility   Streptococcus pneumoniae - MIC*    ERYTHROMYCIN <=0.12 SENSITIVE Sensitive     LEVOFLOXACIN 0.5 SENSITIVE Sensitive     VANCOMYCIN 0.5 SENSITIVE Sensitive     PENICILLIN (meningitis) <=0.06 SENSITIVE Sensitive      PENO - penicillin <=0.06      PENICILLIN (non-meningitis) <=0.06 SENSITIVE Sensitive     PENICILLIN (oral) <=0.06 SENSITIVE Sensitive     CEFTRIAXONE (non-meningitis) <=0.12 SENSITIVE Sensitive     CEFTRIAXONE (meningitis) <=0.12 SENSITIVE Sensitive     * RARE STREPTOCOCCUS PNEUMONIAE    RADIOLOGY STUDIES/RESULTS: DG Chest Port 1 View  Result Date: 05/17/2021 CLINICAL DATA:  Acute respiratory failure. EXAM: PORTABLE CHEST 1 VIEW COMPARISON:  Chest radiograph May 15, 2021 FINDINGS: Patient is rotated with left costophrenic angle obscured by collimation. The cardiac silhouette is enlarged, unchanged. Bibasilar predominant opacities, are increased on the right. No visible pleural effusion or pneumothorax. The visualized skeletal structures are unchanged. IMPRESSION: Cardiomegaly with bibasilar predominant opacities, increased on the right and likely reflecting a combination of atelectasis and edema. Electronically Signed   By: Maudry Mayhew M.D.   On: 05/17/2021 08:06     LOS: 5 days   Jeoffrey Massed, MD  [facility]    To contact the attending provider between 7A-7P or the covering provider during after hours 7P-7A, please log into the web site [email or web address] and access using universal [place] password for that web site. If you do not have the password, please call the hospital operator.  05/17/2021, 9:34 AM

## 2021-05-17 NOTE — Progress Notes (Signed)
RT attempted to place pt on BIPAP, pt took it off 5 minutes after being placed on machine. RN aware.  ?

## 2021-05-17 NOTE — Progress Notes (Signed)
Per RN, April and orders, PICC consult placed.  Okay to remove IVT consult. ?
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
## 2021-05-17 NOTE — Progress Notes (Signed)
IV team consult placed for new PIV access. Patient has had numerous PIV's during hospital stay. Will need IV antibiotics. Recommended PICC line to primary RN due to difficult stick and prolonged therapy. Provider was messaged about recommendation. ? ?Garren Greenman Loyola Mast, RN ? ?

## 2021-05-17 NOTE — Progress Notes (Signed)
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
## 2021-05-17 NOTE — Progress Notes (Signed)
Inpatient Diabetes Program Recommendations ? ?AACE/ADA: New Consensus Statement on Inpatient Glycemic Control (2015) ? ?Target Ranges:  Prepandial:   less than 140 mg/dL ?     Peak postprandial:   less than 180 mg/dL (1-2 hours) ?     Critically ill patients:  140 - 180 mg/dL  ? ?Lab Results  ?Component Value Date  ? GLUCAP 245 (H) 05/17/2021  ? HGBA1C 6.2 (H) 08/22/2019  ? ? ?Review of Glycemic Control ? Latest Reference Range & Units 05/16/21 15:41 05/16/21 19:41 05/17/21 03:55 05/17/21 08:22  ?Glucose-Capillary 70 - 99 mg/dL 226 (H) 333 (H) 545 (H) 245 (H)  ?(H): Data is abnormally high ?Diabetes history: none ?Outpatient Diabetes medications: none ?Current orders for Inpatient glycemic control: Novolog 0-15 units TID & HS ?Solucortef 50 mg Q8H ? ?Inpatient Diabetes Program Recommendations:   ? ?In the setting of steroids, consider adding Novolog 3 units TID (assuming patient is consuming >50% of meals).  ? ?Thanks, ?Lujean Rave, MSN, RNC-OB ?Diabetes Coordinator ?[phone] (8a-5p) ? ? ? ? ?

## 2021-05-18 DIAGNOSIS — J189 Pneumonia, unspecified organism: Secondary | ICD-10-CM | POA: Diagnosis not present

## 2021-05-18 DIAGNOSIS — I5033 Acute on chronic diastolic (congestive) heart failure: Secondary | ICD-10-CM | POA: Diagnosis not present

## 2021-05-18 DIAGNOSIS — J9601 Acute respiratory failure with hypoxia: Secondary | ICD-10-CM | POA: Diagnosis not present

## 2021-05-18 DIAGNOSIS — E035 Myxedema coma: Secondary | ICD-10-CM | POA: Diagnosis not present

## 2021-05-18 LAB — MAGNESIUM: Magnesium: 2 mg/dL (ref 1.7–2.4)

## 2021-05-18 LAB — HEMOGLOBIN A1C
Hgb A1c MFr Bld: 7.2 % — ABNORMAL HIGH (ref 4.8–5.6)
Mean Plasma Glucose: 159.94 mg/dL

## 2021-05-18 LAB — COMPREHENSIVE METABOLIC PANEL
ALT: 105 U/L — ABNORMAL HIGH (ref 0–44)
AST: 29 U/L (ref 15–41)
Albumin: 3.2 g/dL — ABNORMAL LOW (ref 3.5–5.0)
Alkaline Phosphatase: 80 U/L (ref 38–126)
Anion gap: 6 (ref 5–15)
BUN: 20 mg/dL (ref 6–20)
CO2: 42 mmol/L — ABNORMAL HIGH (ref 22–32)
Calcium: 9 mg/dL (ref 8.9–10.3)
Chloride: 90 mmol/L — ABNORMAL LOW (ref 98–111)
Creatinine, Ser: 0.83 mg/dL (ref 0.44–1.00)
GFR, Estimated: >60 mL/min (ref >60)
Glucose, Bld: 188 mg/dL — ABNORMAL HIGH (ref 70–99)
Potassium: 3.1 mmol/L — ABNORMAL LOW (ref 3.5–5.1)
Sodium: 138 mmol/L (ref 135–145)
Total Bilirubin: 0.4 mg/dL (ref 0.3–1.2)
Total Protein: 6.2 g/dL — ABNORMAL LOW (ref 6.5–8.1)

## 2021-05-18 LAB — GLUCOSE, CAPILLARY
Glucose-Capillary: 241 mg/dL — ABNORMAL HIGH (ref 70–99)
Glucose-Capillary: 197 mg/dL — ABNORMAL HIGH (ref 70–99)
Glucose-Capillary: 191 mg/dL — ABNORMAL HIGH (ref 70–99)
Glucose-Capillary: 190 mg/dL — ABNORMAL HIGH (ref 70–99)
Glucose-Capillary: 235 mg/dL — ABNORMAL HIGH (ref 70–99)

## 2021-05-18 MED ORDER — ACETAZOLAMIDE 250 MG PO TABS
500.0000 mg | ORAL_TABLET | Freq: Two times a day (BID) | ORAL | Status: DC
  Administered 2021-05-18 – 2021-05-22 (×8): 500 mg via ORAL
  Filled 2021-05-18 (×10): qty 2

## 2021-05-18 MED ORDER — INSULIN GLARGINE-YFGN 100 UNIT/ML ~~LOC~~ SOLN
10.0000 [IU] | Freq: Every day | SUBCUTANEOUS | Status: DC
  Administered 2021-05-18 – 2021-05-22 (×5): 10 [IU] via SUBCUTANEOUS
  Filled 2021-05-18 (×5): qty 0.1

## 2021-05-18 MED ORDER — HYDROCORTISONE 5 MG PO TABS: 5.0000 mg | ORAL_TABLET | Freq: Two times a day (BID) | ORAL | Status: DC

## 2021-05-18 MED ORDER — HYDROCORTISONE 20 MG PO TABS: 30.0000 mg | ORAL_TABLET | Freq: Two times a day (BID) | ORAL | Status: DC

## 2021-05-18 MED ORDER — HYDROCORTISONE 20 MG PO TABS: 20.0000 mg | ORAL_TABLET | Freq: Two times a day (BID) | ORAL | Status: DC

## 2021-05-18 MED ORDER — POTASSIUM CHLORIDE CRYS ER 20 MEQ PO TBCR
40.0000 meq | EXTENDED_RELEASE_TABLET | Freq: Every day | ORAL | Status: DC
  Administered 2021-05-19 – 2021-05-22 (×4): 40 meq via ORAL
  Filled 2021-05-18 (×4): qty 2

## 2021-05-18 MED ORDER — POTASSIUM CHLORIDE CRYS ER 20 MEQ PO TBCR
40.0000 meq | EXTENDED_RELEASE_TABLET | ORAL | Status: AC
  Administered 2021-05-18 (×2): 40 meq via ORAL
  Filled 2021-05-18 (×2): qty 2

## 2021-05-18 MED ORDER — HYDROCORTISONE 10 MG PO TABS: 10.0000 mg | ORAL_TABLET | Freq: Two times a day (BID) | ORAL | Status: DC

## 2021-05-18 MED ORDER — HYDROCORTISONE 20 MG PO TABS
40.0000 mg | ORAL_TABLET | Freq: Two times a day (BID) | ORAL | Status: DC
Start: 2021-05-19 — End: 2021-05-19
  Administered 2021-05-19: 40 mg via ORAL
  Filled 2021-05-18: qty 2

## 2021-05-18 NOTE — TOC Initial Note (Addendum)
Transition of Care (TOC) - Initial/Assessment Note  ? ? ?Patient Details  ?Name: Gabrielle Carter ?MRN: [id number] ?Date of Birth: [birth year] ? ?Transition of Care (TOC) CM/SW Contact:    ?Tom-Johnson, Renea Ee, RN ?Phone Number: ?05/18/2021, 3:39 PM ? ?Clinical Narrative:                 ? ?CM consulted for home health needs. Admitted for Respiratory Failure. Currently on 3L oxygen. Might need home oxygen. Nursing to do Ambulatory sats to determine.  ?CM spoke with patient at bedside. Patient gave permission to CM to call her mother, Gabrielle Carter and talk with her. CM called and spoke with Gabrielle Carter. States patient is from home and lives with her. Patient is currently on disability. Does not drive. Does not have any DME's at home. Does not have a PCP.  ?CM unable to secure home health d/t patient's Bipolar diagnosis and her Medicaid insurance. Patient and Gabrielle Carter agreed to outpatient PT. CM spoke with Judson Roch [phone]) and order to be faxed to [facility].   ?CM tried to call Medicaid transportation unsuccessful twice today. On the phone for over 20 mins each time waiting for someone to answer call. CM will try again tomorrow.  ?CM called to schedule hospital f/u appointment with [facility] in [place] and spoke with [phone]) and Mist[facility] states they don not accept adult Medicaid at this time. Gabrielle Carter gave CM permission to scheduled with [facility]. Information on AVS.   ?Patient might go home with home oxygen, CM will f/u when order is placed. CM will continue to follow with needs.  ? ? ?Expected Discharge Plan: OP Rehab ?Barriers to Discharge: Continued Medical Work up ? ? ?Patient Goals and CMS Choice ?Patient states their goals for this hospitalization and ongoing recovery are:: To return home ?CMS Medicare.gov Compare Post Acute Care list provided to:: Patient ?Choice offered to / list presented to : Patient ? ?Expected Discharge Plan and Services ?Expected Discharge Plan: OP Rehab ?   ?Discharge Planning Services: CM Consult ?Post Acute Care Choice:  (Outpatient) ?Living arrangements for the past 2 months: Apartment ?                ?DME Arranged: 3-N-1, Walker rolling with seat ?DME Agency: [employer] ?Date DME Agency Contacted: 05/18/21 ?Time DME Agency Contacted: [id number] ?Representative spoke with at DME Agency: Maudie Mercury ?HH Arranged: NA ?[place] Agency: NA ([place] ?  ?  ?  ? ?Prior Living Arrangements/Services ?Living arrangements for the past 2 months: Apartment ?Lives with:: Parents (Mother) ?Patient language and need for interpreter reviewed:: Yes ?Do you feel safe going back to the place where you live?: Yes      ?Need for Family Participation in Patient Care: Yes (Comment) ?Care giver support system in place?: Yes (comment) ?  ?Criminal Activity/Legal Involvement Pertinent to Current Situation/Hospitalization: No - Comment as needed ? ?Activities of Daily Living ?Home Assistive Devices/Equipment: Bedside commode/3-in-1, Walker (specify type) ?ADL Screening (condition at time of admission) ?Patient's cognitive ability adequate to safely complete daily activities?: Yes ?Is the patient deaf or have difficulty hearing?: No ?Does the patient have difficulty seeing, even when wearing glasses/contacts?: No ?Does the patient have difficulty concentrating, remembering, or making decisions?: Yes ?Patient able to express need for assistance with ADLs?: Yes ?Does the patient have difficulty dressing or bathing?: Yes ?Independently performs ADLs?: No ?Communication: Independent ?Dressing (OT): Needs assistance ?Is this a change from baseline?: Change from baseline, expected to last <3days ?Grooming: Needs assistance ?  Is this a change from baseline?: Change from baseline, expected to last <3 days ?Feeding: Independent ?Bathing: Needs assistance ?Is this a change from baseline?: Change from baseline, expected to last <3 days ?Toileting: Needs assistance ?Is this a change from  baseline?: Change from baseline, expected to last <3 days ?In/Out Bed: Needs assistance ?Is this a change from baseline?: Change from baseline, expected to last <3 days ?Walks in Home: Needs assistance ?Is this a change from baseline?: Change from baseline, expected to last <3 days ?Does the patient have difficulty walking or climbing stairs?: Yes ?Weakness of Legs: Both ?Weakness of Arms/Hands: Both ? ?Permission Sought/Granted ?Permission sought to share information with : Case Manager, Customer service manager, Family Supports ?Permission granted to share information with : Yes, Verbal Permission Granted ?   ?   ?   ?   ? ?Emotional Assessment ?Appearance:: Appears stated age ?Attitude/Demeanor/Rapport: Engaged ?Affect (typically observed): Anxious ?Orientation: : Oriented to Self, Oriented to Place, Oriented to Situation ?Alcohol / Substance Use: Not Applicable ?Psych Involvement: No (comment) ? ?Admission diagnosis:  Acute respiratory failure with hypoxia and hypercapnia (HCC) [J96.01, J96.02] ?Patient Active Problem List  ? Diagnosis Date Noted  ? Acute respiratory failure with hypoxia and hypercapnia ([place]) 05/12/2021  ? Myxedema coma ([employer])   ? Bipolar 1 disorder ([place]) 08/21/2019  ? Schizophrenia ([place]) 08/21/2019  ? Wheezing   ? Schizoaffective disorder ([place]) 06/04/2018  ? ?PCP:  Patient, No Pcp Per (Inactive) ?Pharmacy:   ?[facility] [id number] ?[address] ?[place] [address] ?Phone: [phone] Fax: [phone] ? ? ? ? ?Social Determinants of Health (SDOH) Interventions ?  ? ?Readmission Risk Interventions ?No flowsheet data found. ? ? ?

## 2021-05-18 NOTE — Evaluation (Signed)
Occupational Therapy Evaluation Patient Details Name: Gabrielle Carter MRN: [id number] DOB: [birth year] Today's Date: 05/18/2021   History of Present Illness 46 y.o. woman brought to [facility] via EMS when her mother found her lethargic at home. SpO2 40-60s in field, placed on NRB. Intubated and central line placed 05/11/21. 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 Extubated 3/16 placed on biPAP due to hypercarbia and somnolence, off biPAP 3/18   PMH: bipolar disorder ?schizophrenia with inpatient behavioral health stay, ptsd, hypothyroidism, COPD with ongoing tobacco use disorder.   Clinical Impression   PTA, pt lives with family and reports Independence with ADLs, household IADls and mobility without AD. Pt presents with minor deficits in cardiopulmonary tolerance, strength, dynamic standing balance, and cognition. Pt requires Setup for UB ADLs, min guard for LB ADLs and min guard for in-room mobility via handheld assist (no RW available). Pt reports fatigue after standing ADLs > 5 min and requested seated rest break. Educated on energy conservation strategies to implement at home at discharge. Plan to further address energy conservation, ADL endurance and med mgmt abilities via pill box test in next sessions. Anticipate no OT needs at DC as pt reports 24/7 support from family at DC.   SpO2 97% on 3 L O2 at rest, 90% on 3 L O2 during ADLs BP pre activity: 109/83 (91) BP post activity: 127/74 (90)      Recommendations for follow up therapy are one component of a multi-disciplinary discharge planning process, led by the attending physician.  Recommendations may be updated based on patient status, additional functional criteria and insurance authorization.   Follow Up Recommendations  No OT follow up    Assistance Recommended at  Discharge Intermittent Supervision/Assistance  Patient can return home with the following Assistance with cooking/housework;Direct supervision/assist for medications management;Direct supervision/assist for financial management;Assist for transportation    Functional Status Assessment  Patient has had a recent decline in their functional status and demonstrates the ability to make significant improvements in function in a reasonable and predictable amount of time.  Equipment Recommendations  None recommended by OT    Recommendations for Other Services       Precautions / Restrictions Precautions Precautions: Fall;Other (comment) Precaution Comments: monitor O2 (does not wear at baseline) Restrictions Weight Bearing Restrictions: No      Mobility Bed Mobility Overal bed mobility: Modified Independent             General bed mobility comments: increased time    Transfers Overall transfer level: Needs assistance Equipment used: 1 person hand held assist Transfers: Sit to/from Stand Sit to Stand: Min guard           General transfer comment: increased time to rise without AD (no RW in room) but able to complete without physical assist      Balance Overall balance assessment: Mild deficits observed, not formally tested                                         ADL either performed or assessed with clinical judgement   ADL Overall ADL's : Needs assistance/impaired Eating/Feeding: Independent;Sitting   Grooming: Supervision/safety;Standing;Wash/dry face Grooming Details (indicate cue type and reason): able to wash face standing at sink. afterwards, pt reports "im done" - when asked  for specifics, reports "done with all of it" and declined further ADLs at sink Upper Body Bathing: Set up;Sitting   Lower Body Bathing: Min guard;Sit to/from stand;Sitting/lateral leans   Upper Body Dressing : Set up;Sitting   Lower Body Dressing: Min  guard;Sitting/lateral leans;Sit to/from stand   Toilet Transfer: Min guard;Ambulation   Toileting- Clothing Manipulation and Hygiene: Min guard;Sitting/lateral lean;Sit to/from stand       Functional mobility during ADLs: Min guard General ADL Comments: pt with baseline mental illness diagnoses that impact cognition. Physically, pt with decreased endurance reporting fatigue after 5 min OOB ADLs. Educated on energy conservation strategies, may plan to administer pill box as pt reports independently completing this at home     Vision Ability to See in Adequate Light: 0 Adequate Patient Visual Report: No change from baseline Vision Assessment?: No apparent visual deficits     Perception     Praxis      Pertinent Vitals/Pain Pain Assessment Pain Assessment: No/denies pain     Hand Dominance Right   Extremity/Trunk Assessment Upper Extremity Assessment Upper Extremity Assessment: Overall WFL for tasks assessed   Lower Extremity Assessment Lower Extremity Assessment: Defer to PT evaluation   Cervical / Trunk Assessment Cervical / Trunk Assessment: Normal   Communication Communication Communication: No difficulties   Cognition Arousal/Alertness: Awake/alert Behavior During Therapy: Flat affect Overall Cognitive Status: No family/caregiver present to determine baseline cognitive functioning                                 General Comments: flat affect; hx of multiple mental illness diagnoses. responds appropriately to questions, typically yes/no or short phrases     General Comments       Exercises     Shoulder Instructions      Home Living Family/patient expects to be discharged to:: Private residence Living Arrangements: Parent (mother and step father) Available Help at Discharge: Family;Available 24 hours/day Type of Home: Apartment Home Access: Level entry     Home Layout: One level     Bathroom Shower/Tub: Contractor: Standard Bathroom Accessibility: Yes   Home Equipment: Grab bars - tub/shower;Hand held shower head;Tub bench   Additional Comments: pt denied having shower chair type equipment at home on OT eval      Prior Functioning/Environment Prior Level of Function : Independent/Modified Independent             Mobility Comments: no use of AD for mobility ADLs Comments: reports independence with ADLs (stands for showers per pt), reports cooking, cleaning and managing meds with pill box        OT Problem List: Decreased strength;Decreased activity tolerance;Impaired balance (sitting and/or standing);Decreased cognition;Decreased knowledge of use of DME or AE;Cardiopulmonary status limiting activity      OT Treatment/Interventions: Self-care/ADL training;Therapeutic exercise;Energy conservation;DME and/or AE instruction;Therapeutic activities;Balance training;Patient/family education    OT Goals(Current goals can be found in the care plan section) Acute Rehab OT Goals Patient Stated Goal: get a snack OT Goal Formulation: With patient Time For Goal Achievement: 06/01/21 Potential to Achieve Goals: Good ADL Goals Pt Will Transfer to Toilet: Independently;ambulating Additional ADL Goal #1: Pt to increase standing activity tolerance > 10 min during ADLs/mobility without rest break Additional ADL Goal #2: Pt to verbalize at least 3 energy conservation strategies to implement during ADLs, IADLs, and mobility Additional ADL Goal #3: Pt to complete pill box test with 0  errors  OT Frequency: Min 2X/week    Co-evaluation              AM-PAC OT "6 Clicks" Daily Activity     Outcome Measure Help from another person eating meals?: None Help from another person taking care of personal grooming?: A Little Help from another person toileting, which includes using toliet, bedpan, or urinal?: A Little Help from another person bathing (including washing, rinsing, drying)?: A Little Help  from another person to put on and taking off regular upper body clothing?: A Little Help from another person to put on and taking off regular lower body clothing?: A Little 6 Click Score: 19   End of Session Equipment Utilized During Treatment: Gait belt;Oxygen Nurse Communication: Mobility status;Other (comment) (requests snack, small amount of blood on bed pad)  Activity Tolerance: Patient tolerated treatment well Patient left: in bed;with call bell/phone within reach;with bed alarm set  OT Visit Diagnosis: Other abnormalities of gait and mobility (R26.89)                Time: 1610-9604 OT Time Calculation (min): 24 min Charges:  OT General Charges $OT Visit: 1 Visit OT Evaluation $OT Eval Moderate Complexity: 1 Mod OT Treatments $Self Care/Home Management : 8-22 mins  Bradd Canary, OTR/L Acute Rehab Services Office: [phone]   Lorre Munroe 05/18/2021, 9:40 AM
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
## 2021-05-18 NOTE — Progress Notes (Addendum)
?      ?                 PROGRESS NOTE ? ?      ?PATIENT DETAILS ?Name: Gabrielle Carter ?Age: 46 y.o. ?Sex: female ?Date of Birth: Jul 13, 1975 ?Admit Date: 05/12/2021 ?Admitting Physician Charlott Holler, MD ?[id number], No Pcp Per (Inactive) ? ?Brief Summary: ?Patient is a 46 y.o.  female with history of bipolar disorder, hypothyroidism, COPD, OSA-who was brought to [facility] with lethargy-she was found to have acute hypercarbic respiratory failure with CO2 in the 90s-patient was emergently intubated and subsequently transferred to [facility] ICU.  She was also found to have a TSH of 141.  See below for further details. ? ?Significant events: ?3/14>> brought to [facility] health ED by EMS with hypercapnia/hypoxemic-lethargic.  Intubated.  TSH 141 at [facility]. ?3/15>> transferred to [facility] ICU. ?3/16>> extubated ?3/17>> placed on BiPAP due to somnolence/hypercarbia ?3/18>> off BiPAP ?3/20>> transfer to [facility] ? ?Significant studies: ?3/14>> TSH 141 ?3/14>> CT head (at [facility]): No acute intracranial process ?3/14>> CT chest (at [facility]): Moderate to large pericardial effusion, multifocal groundglass opacities/consolidation in the lungs bilaterally.   ?3/15>> CXR: No obvious PNA-ET tube in place. ?3/15>> Echo: EF 50-55%.  Mild reduction in RV systolic function.  Moderate pericardial effusion-no tamponade. ?3/15>> TSH 95.9 ?3/18>> CXR: Opacity in the medial right lung-?  Developing PNA. ? ?Significant microbiology data: ?3/15>> tracheal aspirate: Streptococcus PNA ? ?Procedures: ?3/14-3/16>> ETT ? ?Consults: ?PCCM ? ?Subjective: ?Refused CPAP/BiPAP last night.  Still volume overloaded but overall much better.  Inquiring about discharge date. ? ?Objective: ?Vitals: ?Blood pressure 115/70, pulse 79, temperature 98.4 ?F (36.9 ?C), temperature source Oral, resp. rate 14, height 5\' 8"  (1.727 m), weight 131.5 kg, SpO2 90 %.  ? ?Exam: ?Gen Exam:Alert awake-not in any distress ?HEENT:atraumatic,  normocephalic ?Chest: Few scattered rhonchi ?CVS:S1S2 regular ?Abdomen:soft non tender, non distended ?Extremities:+edema ?Neurology: Non focal ?Skin: no rash  ? ?Pertinent Labs/Radiology: ?CBC Latest Ref Rng & Units 05/17/2021 05/16/2021 05/16/2021  ?WBC 4.0 - 10.5 K/uL 12.2(H) - 12.1(H)  ?Hemoglobin 12.0 - 15.0 g/dL 05/18/2021 15.3(H) 12.6  ?Hematocrit 36.0 - 46.0 % 43.0 45.0 42.9  ?Platelets 150 - 400 K/uL 200 - 188  ?  ?Lab Results  ?Component Value Date  ? NA 138 05/18/2021  ? K 3.1 (L) 05/18/2021  ? CL 90 (L) 05/18/2021  ? CO2 42 (H) 05/18/2021  ?  ? ? ?Assessment/Plan: ?Acute hypercarbic/hypoxemic respiratory failure: Multifactorial due to myxedema coma/OSA-with some elements of PNA/HFpEF exacerbation contributing as well.  Extubated on 3/16-noncompliant-refusing BiPAP-however seems to be stable on oxygen-Down to 3 L this morning.  Suspect will require home O2 on discharge.  See below regarding diuretics/antibiotics. ? ?Myxedema coma: Due to noncompliance-continue oral levothyroxine 150 mcg daily (was on IV levothyroxine 75 mcg until 3/19).  Continue to taper down steroids.  Since dosage of levothyroxine just adjusted recently-suspect okay to repeat TSH in a few days.  Note ACTH stim test was done on 3/17-this is unreliable as patient was on IV hydrocortisone.  Once steroids are tapered off-we will need to reassess if patient has any signs of adrenal insufficiency. ? ?Newly diagnosed DM-2 (A1c 7.2 on 3/21) with steroid-induced hyperglycemia: CBGs remain elevated-continue SSI-add 10 units of Semglee.  Suspect the steroids can taper down further-CBGs will improve.  ? ?Acute metabolic encephalopathy: Due to hypercarbia/myxedema coma-resolved. ? ?Pneumococcal pneumonia: Continue IV Rocephin-x5 days total.  Overall improved-hypoxia improving-Down to 3 L of oxygen  today. ? ?HFpEF exacerbation: Still with significant lower extremity edema-continue Lasix/acetazolamide (started due to significantly elevated serum bicarb)   ? ?Moderate pericardial effusion: Likely due to myxedema-no tamponade physiology evident on echo.  Plan is to repeat echocardiogram in 6 to 8 weeks. ? ?Hypokalemia: Likely due to diuretics-being repleted.  Recheck tomorrow morning. ? ?Transaminitis: Mild-downtrending-unclear etiology-follow for now.  Acute hepatitis serology was negative. ? ?COPD: Not in exacerbation-continue bronchodilators. ? ?OSA: Unfortunately noncompliant to CPAP at home-and even in the hospital.  Have counseled extensively today-if she consents-use BiPAP nightly and when she sleeps.  Interestingly-mother claims that she does not have a CPAP device at home.  Will likely require home O2-please reassess at the time of discharge. ? ?Bipolar disorder: Has been noncompliant with her medications-since she wanted to restart psych medications-psychiatry was consulted-now on trazodone and risperidone.  Psychiatry following. ? ?Nutrition Status: ?Nutrition Problem: Inadequate oral intake ?Etiology: inability to eat ?Signs/Symptoms: NPO status ?Interventions: Tube feeding ? ?Morbid obesity: ?Estimated body mass index is 44.08 kg/m? as calculated from the following: ?  Height as of this encounter: 5\' 8"  (1.727 m). ?  Weight as of this encounter: 131.5 kg.  ? ?Code status: ?  Code Status: Full Code  ? ?DVT Prophylaxis: ?heparin injection 5,000 Units Start: 05/12/21 1400 ?SCDs Start: 05/12/21 0129 ?  ?Family Communication: Mother-Juanita [phone] updated over the phone. ? ? ?Disposition Plan: ?Status is: Inpatient ?Remains inpatient appropriate because: Improving hypoxemia-on IV antibiotics/diuretics-see above-not yet stable for discharge.  Suspect requires several more days of hospitalization.   ?  ?Planned Discharge Destination:Home health  ? ? ?Diet: ?Diet Order   ? ?       ?  Diet Heart Room service appropriate? Yes; Fluid consistency: Thin  Diet effective now       ?  ? ?  ?  ? ?  ?  ? ? ?Antimicrobial agents: ?Anti-infectives (From  admission, onward)  ? ? Start     Dose/Rate Route Frequency Ordered Stop  ? 05/16/21 1015  cefTRIAXone (ROCEPHIN) 1 g in sodium chloride 0.9 % 100 mL IVPB       ? 1 g ?200 mL/hr over 30 Minutes Intravenous Every 24 hours 05/16/21 0922 05/20/21 2359  ? 05/12/21 1800  cefTRIAXone (ROCEPHIN) 1 g in sodium chloride 0.9 % 100 mL IVPB       ? 1 g ?200 mL/hr over 30 Minutes Intravenous Every 24 hours 05/12/21 0230 05/12/21 1802  ? 05/12/21 1800  azithromycin (ZITHROMAX) 250 mg in dextrose 5 % 125 mL IVPB       ? 250 mg ?127.5 mL/hr over 60 Minutes Intravenous Every 24 hours 05/12/21 0230 05/12/21 1923  ? ?  ? ? ? ?MEDICATIONS: ?Scheduled Meds: ? acetaZOLAMIDE  500 mg Oral BID  ? arformoterol  15 mcg Nebulization BID  ? budesonide (PULMICORT) nebulizer solution  0.25 mg Nebulization BID  ? famotidine  20 mg Oral Daily  ? feeding supplement  237 mL Oral BID BM  ? furosemide  40 mg Intravenous Daily  ? heparin  5,000 Units Subcutaneous Q8H  ? [START ON 05/19/2021] hydrocortisone  40 mg Oral BID  ? Followed by  ? [START ON 05/20/2021] hydrocortisone  30 mg Oral BID  ? Followed by  ? [START ON 05/21/2021] hydrocortisone  20 mg Oral BID  ? Followed by  ? [START ON 05/22/2021] hydrocortisone  10 mg Oral BID  ? Followed by  ? [START ON 05/23/2021] hydrocortisone  5 mg Oral BID  ?  hydrocortisone sod succinate (SOLU-CORTEF) inj  75 mg Intravenous Q12H  ? insulin aspart  0-15 Units Subcutaneous TID WC  ? insulin aspart  0-5 Units Subcutaneous QHS  ? insulin glargine-yfgn  10 Units Subcutaneous Daily  ? levothyroxine  150 mcg Oral Q0600  ? lidocaine  1 patch Transdermal Q24H  ? mouth rinse  15 mL Mouth Rinse q12n4p  ? multivitamin with minerals  1 tablet Oral Daily  ? potassium chloride  40 mEq Oral Q4H  ? [START ON 05/19/2021] potassium chloride  40 mEq Oral Daily  ? risperiDONE  1 mg Oral QHS  ? sodium chloride flush  10-40 mL Intracatheter Q12H  ? traZODone  50 mg Oral QHS  ? ?Continuous Infusions: ? sodium chloride 10 mL/hr at 05/18/21  1013  ? cefTRIAXone (ROCEPHIN)  IV 1 g (05/18/21 1016)  ? ?PRN Meds:.albuterol, docusate, ipratropium-albuterol, polyethylene glycol, sodium chloride flush ? ? ?I have personally reviewed following labs and imaging studies ? ?L

## 2021-05-18 NOTE — Clinical Note (Incomplete)
SATURATION QUALIFICATIONS: (This note is used to comply with regulatory documentation for home oxygen)  Patient Saturations on Room Air at Rest = ***%  Patient Saturations on Room Air while Ambulating = ***%  Patient Saturations on *** Liters of oxygen while Ambulating = ***%  Please briefly explain why patient needs home oxygen: 

## 2021-05-19 DIAGNOSIS — I5033 Acute on chronic diastolic (congestive) heart failure: Secondary | ICD-10-CM | POA: Diagnosis not present

## 2021-05-19 DIAGNOSIS — E035 Myxedema coma: Secondary | ICD-10-CM | POA: Diagnosis not present

## 2021-05-19 DIAGNOSIS — J9601 Acute respiratory failure with hypoxia: Secondary | ICD-10-CM | POA: Diagnosis not present

## 2021-05-19 DIAGNOSIS — J9602 Acute respiratory failure with hypercapnia: Secondary | ICD-10-CM | POA: Diagnosis not present

## 2021-05-19 LAB — CBC
HCT: 43.9 % (ref 36.0–46.0)
Hemoglobin: 12.7 g/dL (ref 12.0–15.0)
MCH: 25.6 pg — ABNORMAL LOW (ref 26.0–34.0)
MCHC: 28.9 g/dL — ABNORMAL LOW (ref 30.0–36.0)
MCV: 88.5 fL (ref 80.0–100.0)
Platelets: 210 10*3/uL (ref 150–400)
RBC: 4.96 MIL/uL (ref 3.87–5.11)
RDW: 17.4 % — ABNORMAL HIGH (ref 11.5–15.5)
WBC: 10.3 10*3/uL (ref 4.0–10.5)
nRBC: 0 % (ref 0.0–0.2)

## 2021-05-19 LAB — BASIC METABOLIC PANEL
Anion gap: 9 (ref 5–15)
BUN: 23 mg/dL — ABNORMAL HIGH (ref 6–20)
CO2: 38 mmol/L — ABNORMAL HIGH (ref 22–32)
Calcium: 9.3 mg/dL (ref 8.9–10.3)
Chloride: 92 mmol/L — ABNORMAL LOW (ref 98–111)
Creatinine, Ser: 0.74 mg/dL (ref 0.44–1.00)
GFR, Estimated: >60 mL/min (ref >60)
Glucose, Bld: 215 mg/dL — ABNORMAL HIGH (ref 70–99)
Potassium: 3.5 mmol/L (ref 3.5–5.1)
Sodium: 139 mmol/L (ref 135–145)

## 2021-05-19 LAB — GLUCOSE, CAPILLARY
Glucose-Capillary: 133 mg/dL — ABNORMAL HIGH (ref 70–99)
Glucose-Capillary: 166 mg/dL — ABNORMAL HIGH (ref 70–99)
Glucose-Capillary: 171 mg/dL — ABNORMAL HIGH (ref 70–99)

## 2021-05-19 MED ORDER — HYDROCORTISONE 20 MG PO TABS
20.0000 mg | ORAL_TABLET | Freq: Two times a day (BID) | ORAL | Status: DC
  Filled 2021-05-19: qty 1

## 2021-05-19 MED ORDER — HYDROCORTISONE 5 MG PO TABS: 2.5000 mg | ORAL_TABLET | Freq: Two times a day (BID) | ORAL | Status: DC

## 2021-05-19 MED ORDER — CHLORHEXIDINE GLUCONATE CLOTH 2 % EX PADS
6.0000 | MEDICATED_PAD | Freq: Every day | CUTANEOUS | Status: DC
  Administered 2021-05-19 – 2021-05-22 (×3): 6 via TOPICAL

## 2021-05-19 MED ORDER — HYDROCORTISONE 5 MG PO TABS: 5.0000 mg | ORAL_TABLET | Freq: Two times a day (BID) | ORAL | Status: DC

## 2021-05-19 MED ORDER — HYDROCORTISONE 10 MG PO TABS: 10.0000 mg | ORAL_TABLET | Freq: Two times a day (BID) | ORAL | Status: DC

## 2021-05-19 MED ORDER — HYDROCORTISONE 20 MG PO TABS
30.0000 mg | ORAL_TABLET | Freq: Two times a day (BID) | ORAL | Status: AC
  Administered 2021-05-19: 30 mg via ORAL
  Filled 2021-05-19 (×2): qty 1

## 2021-05-19 MED ORDER — MIDODRINE HCL 5 MG PO TABS
5.0000 mg | ORAL_TABLET | Freq: Two times a day (BID) | ORAL | Status: DC
  Administered 2021-05-19 – 2021-05-20 (×4): 5 mg via ORAL
  Filled 2021-05-19 (×4): qty 1

## 2021-05-19 NOTE — Progress Notes (Addendum)
Inpatient Diabetes Program Recommendations ? ?AACE/ADA: New Consensus Statement on Inpatient Glycemic Control  ? ?Target Ranges:  Prepandial:   less than 140 mg/dL ?     Peak postprandial:   less than 180 mg/dL (1-2 hours) ?     Critically ill patients:  140 - 180 mg/dL  ? ? Latest Reference Range & Units 05/18/21 07:29 05/18/21 12:23 05/18/21 15:38 05/18/21 17:49 05/18/21 21:48 05/19/21 07:45  ?Glucose-Capillary 70 - 99 mg/dL 741 (H) 287 (H) 867 (H) 190 (H) 235 (H) 133 (H)  ?(H): Data is abnormally high ? Latest Reference Range & Units 05/18/21 03:16  ?Hemoglobin A1C 4.8 - 5.6 % 7.2 (H)  ? ?Review of Glycemic Control ? ?Diabetes history: No ?Outpatient Diabetes medications: NA ?Current orders for Inpatient glycemic control: Semglee 10 units daily, Novolog 0-15 units TID with meals, Novolog 0-5 units QHS; Solucortef 40 mg BID (tapering) ? ?Inpatient Diabetes Program Recommendations:   ? ?HbgA1C: A1C 7.2% on 05/18/21 indicating an average glucose of 160 mg/dl over the past 2-3 months. If attending provider is going to dx pt with DM this admission, please inform patient, nursing staff so patient can be educated, consult diabetes coordinator and consult RD for diet education. ? ?NOTE: Patient admitted on 05/12/21 and has received steroids since 05/12/21 at 2:41 am and still ordered steroids (tapering). A1C 7.2% on 05/18/21 after 7 days of receiving IV steroids. Would recommend repeating A1C 3 months after steroid use.   ? ?Addendum 05/19/21@10 :45-Went by to speak with patient regarding glycemic control. Patient in bed asleep but awakened to name. Patient states that she has no prior DM or preDM hx. Patient is not sure when she last had any blood work done prior to admission in regards to her glucose. Discussed that she is currently ordered steroids which are contributing to hyperglycemia and she is ordered insulin to help keep glucose controlled currently. Inquired about knowledge of an A1C and patient states she has no  knowledge of what it is. Explained what an A1C is and explained there her A1C was 7.2% on 05/18/21. Informed patient that attending provider may be diagnosing her with DM during this admission. Patient states that a provider has not said anything to her about DM. Explained that if she is dx with DM that nursing staff will be educating her on DM and diabetes coordinator should be consulted so we can follow up with her while inpatient as well. Patient verbalized understanding of information discussed. ? ?Thanks, ?Orlando Penner, RN, MSN, CDE ?Diabetes Coordinator ?Inpatient Diabetes Program ?[phone] (Team Pager from 8am to 5pm) ? ? ?

## 2021-05-19 NOTE — Consult Note (Signed)
Psych brief consult note: ?Attempted to see patient this morning. She requested [place] come in the afternoon to see her. ?---- ? ?Spoke with Vernona Rieger, RN from [place] [phone]) regarding patient's psych meds. Patient last had [place] shot on January 5th, 2023. Last saw doctor on October 10th, 2022. Her current outpatient medications are: ? ?Invega 234 mg monthly ?Prozac 40 mg qAM ?Tegretol XR 400 mg q12hrs ?Topamax 50 mg qPM for nightmares ? ?Her mail-in pharmacy is [facility] ([place], [place]). Their phone is [phone]. ? ?

## 2021-05-19 NOTE — Progress Notes (Signed)
?      ?                 PROGRESS NOTE ? ?      ?PATIENT DETAILS ?Name: Gabrielle Carter ?Age: 46 y.o. ?Sex: female ?Date of Birth: Sep 23, 1975 ?Admit Date: 05/12/2021 ?Admitting Physician Spero Geralds, MD ?[id number], No Pcp Per (Inactive) ? ?Brief Summary: ?Patient is a 46 y.o.  female with history of bipolar disorder, hypothyroidism, COPD, OSA-who was brought to [facility] with lethargy-she was found to have acute hypercarbic respiratory failure with CO2 in the 90s-patient was emergently intubated and subsequently transferred to [facility] ICU.  She was also found to have a TSH of 141.  See below for further details. ? ?Significant events: ?3/14>> brought to [place] ED by EMS with hypercapnia/hypoxemic-lethargic.  Intubated.  TSH 141 at [facility]. ?3/15>> transferred to [facility] ICU. ?3/16>> extubated ?3/17>> placed on BiPAP due to somnolence/hypercarbia ?3/18>> off BiPAP ?3/20>> transfer to [facility] ? ?Significant studies: ?3/14>> TSH 141 ?3/14>> CT head (at [facility]): No acute intracranial process ?3/14>> CT chest (at [facility]): Moderate to large pericardial effusion, multifocal groundglass opacities/consolidation in the lungs bilaterally.   ?3/15>> CXR: No obvious PNA-ET tube in place. ?3/15>> Echo: EF 50-55%.  Mild reduction in RV systolic function.  Moderate pericardial effusion-no tamponade. ?3/15>> TSH 95.9 ?3/18>> CXR: Opacity in the medial right lung-?  Developing PNA. ? ?Significant microbiology data: ?3/15>> tracheal aspirate: Streptococcus PNA ? ?Procedures: ?3/14-3/16>> ETT ? ?Consults: ?PCCM ? ?Subjective: ?Patient again refused CPAP /BiPAP overnight, otherwise no significant events . ? ?Objective: ?Vitals: ?Blood pressure 98/63, pulse 74, temperature 98.2 ?F (36.8 ?C), temperature source Oral, resp. rate 14, height 5\' 8"  (1.727 m), weight 134.5 kg, SpO2 92 %.  ? ?Exam: ? ?Sleeping comfortably, wakes up and answer yes and no question go back to sleep ?Symmetrical Chest wall  movement, scattered rhonchi ?RRR,No Gallops,Rubs or new Murmurs, No Parasternal Heave ?+ve B.Sounds, Abd Soft, No tenderness, No rebound - guarding or rigidity. ?No Cyanosis, Clubbing ,+edema, No new Rash or bruise   ? ? ?Pertinent Labs/Radiology: ? ?  Latest Ref Rng & Units 05/19/2021  ?  1:46 AM 05/17/2021  ?  4:23 AM 05/16/2021  ?  9:11 AM  ?CBC  ?WBC 4.0 - 10.5 K/uL 10.3   12.2     ?Hemoglobin 12.0 - 15.0 g/dL 12.7   12.9   15.3    ?Hematocrit 36.0 - 46.0 % 43.9   43.0   45.0    ?Platelets 150 - 400 K/uL 210   200     ?  ?Lab Results  ?Component Value Date  ? NA 139 05/19/2021  ? K 3.5 05/19/2021  ? CL 92 (L) 05/19/2021  ? CO2 38 (H) 05/19/2021  ?  ? ? ?Assessment/Plan: ? ?Acute hypercarbic/hypoxemic respiratory failure:  ?- Multifactorial due to myxedema coma/OSA-with some elements of PNA/HFpEF exacerbation contributing as well.   ?- Extubated on 3/16-noncompliant-refusing BiPAP ?-Still requiring oxygen, on 4 L nasal cannula this morning, likely will need home oxygen on discharge. ? ? ?Myxedema coma:  ?-Due to noncompliance-continue oral levothyroxine 150 mcg daily (was on IV levothyroxine 75 mcg until 3/19).  Continue to taper down steroids.  Since dosage of levothyroxine just adjusted recently-suspect okay to repeat TSH in a few days. ?-   Note ACTH stim test was done on 3/17-this is unreliable as patient was on IV hydrocortisone.  Once steroids are tapered off-we will need to reassess if patient has  any signs of adrenal insufficiency. ? ?Newly diagnosed DM-2 (A1c 7.2 on 3/21) with steroid-induced hyperglycemia:  ?- CBGs remain elevated-continue SSI-add 10 units of Semglee.  Suspect the steroids can taper down further-CBGs will improve.  ? ?Acute metabolic encephalopathy:  ?Due to hypercarbia/myxedema coma-resolved. ? ?Pneumococcal pneumonia: Continue IV Rocephin-x5 days total.  Overall improved-hypoxia improving-Down to 3 L of oxygen today. ? ?HFpEF exacerbation:  ?- Still with significant lower extremity  edema-continue Lasix/acetazolamide (started due to significantly elevated serum bicarb)  ?-She is only -222 mL since admission unable to increase her Lasix dosing especially with soft blood pressure, will start on midodrine to allow more room for diuresis. ? ?Moderate pericardial effusion: Likely due to myxedema-no tamponade physiology evident on echo.  Plan is to repeat echocardiogram in 6 to 8 weeks. ? ?Hypokalemia: Likely due to diuretics-being repleted.  Recheck tomorrow morning. ? ?Transaminitis: Mild-downtrending-unclear etiology-follow for now.  Acute hepatitis serology was negative. ? ?COPD: Not in exacerbation-continue bronchodilators. ? ?OSA: Unfortunately noncompliant to CPAP at home-and even in the hospital.  Have counseled extensively today-if she consents-use BiPAP nightly and when she sleeps.  Interestingly-mother claims that she does not have a CPAP device at home.  Will likely require home O2-will reassess at the time of discharge. ? ?Bipolar disorder: Has been noncompliant with her medications-since she wanted to restart psych medications-psychiatry was consulted-now on trazodone and risperidone.  Psychiatry following. ? ?Nutrition Status: ?Nutrition Problem: Inadequate oral intake ?Etiology: inability to eat ?Signs/Symptoms: NPO status ?Interventions: Tube feeding ? ?Morbid obesity: ?Estimated body mass index is 45.09 kg/m? as calculated from the following: ?  Height as of this encounter: 5\' 8"  (1.727 m). ?  Weight as of this encounter: 134.5 kg.  ? ?Code status: ?  Code Status: Full Code  ? ?DVT Prophylaxis: ?heparin injection 5,000 Units Start: 05/12/21 1400 ?SCDs Start: 05/12/21 0129 ?  ?Family Communication: none at bedside. ? ? ?Disposition Plan: ?Status is: Inpatient ?Remains inpatient appropriate because: Improving hypoxemia-on IV antibiotics/diuretics-see above-not yet stable for discharge.  Suspect requires several more days of hospitalization.   ?  ?Planned Discharge Destination:Home  health  ? ? ?Diet: ?Diet Order   ? ?       ?  Diet Heart Room service appropriate? Yes; Fluid consistency: Thin  Diet effective now       ?  ? ?  ?  ? ?  ?  ? ? ?Antimicrobial agents: ?Anti-infectives (From admission, onward)  ? ? Start     Dose/Rate Route Frequency Ordered Stop  ? 05/16/21 1015  cefTRIAXone (ROCEPHIN) 1 g in sodium chloride 0.9 % 100 mL IVPB       ? 1 g ?200 mL/hr over 30 Minutes Intravenous Every 24 hours 05/16/21 0922 05/20/21 2359  ? 05/12/21 1800  cefTRIAXone (ROCEPHIN) 1 g in sodium chloride 0.9 % 100 mL IVPB       ? 1 g ?200 mL/hr over 30 Minutes Intravenous Every 24 hours 05/12/21 0230 05/12/21 1802  ? 05/12/21 1800  azithromycin (ZITHROMAX) 250 mg in dextrose 5 % 125 mL IVPB       ? 250 mg ?127.5 mL/hr over 60 Minutes Intravenous Every 24 hours 05/12/21 0230 05/12/21 1923  ? ?  ? ? ? ?MEDICATIONS: ?Scheduled Meds: ? acetaZOLAMIDE  500 mg Oral BID  ? arformoterol  15 mcg Nebulization BID  ? budesonide (PULMICORT) nebulizer solution  0.25 mg Nebulization BID  ? Chlorhexidine Gluconate Cloth  6 each Topical Daily  ? famotidine  20 mg  Oral Daily  ? feeding supplement  237 mL Oral BID BM  ? furosemide  40 mg Intravenous Daily  ? heparin  5,000 Units Subcutaneous Q8H  ? hydrocortisone  40 mg Oral BID  ? Followed by  ? [START ON 05/20/2021] hydrocortisone  30 mg Oral BID  ? Followed by  ? [START ON 05/21/2021] hydrocortisone  20 mg Oral BID  ? Followed by  ? [START ON 05/22/2021] hydrocortisone  10 mg Oral BID  ? Followed by  ? [START ON 05/23/2021] hydrocortisone  5 mg Oral BID  ? insulin aspart  0-15 Units Subcutaneous TID WC  ? insulin aspart  0-5 Units Subcutaneous QHS  ? insulin glargine-yfgn  10 Units Subcutaneous Daily  ? levothyroxine  150 mcg Oral Q0600  ? lidocaine  1 patch Transdermal Q24H  ? mouth rinse  15 mL Mouth Rinse q12n4p  ? multivitamin with minerals  1 tablet Oral Daily  ? potassium chloride  40 mEq Oral Daily  ? risperiDONE  1 mg Oral QHS  ? sodium chloride flush  10-40 mL  Intracatheter Q12H  ? traZODone  50 mg Oral QHS  ? ?Continuous Infusions: ? sodium chloride Stopped (05/18/21 1153)  ? cefTRIAXone (ROCEPHIN)  IV 1 g (05/19/21 0850)  ? ?PRN Meds:.albuterol, docusate, ipratropium-albut

## 2021-05-19 NOTE — Progress Notes (Signed)
Pt refusing bipap.

## 2021-05-19 NOTE — Progress Notes (Signed)
Physical Therapy Treatment ?Patient Details ?Name: Gabrielle Carter ?MRN: [id number] ?DOB: 07/09/1975 ?Today's Date: 05/19/2021 ? ? ?History of Present Illness 46 y.o. woman brought to [facility] via EMS when her mother found her lethargic at home. SpO2 40-60s in field, placed on NRB. Intubated and central line placed 05/11/21. Transferred to [facility] ICU 05/12/21 for treatment of myxedema Coma, acute hypoxemic and hypercapnic respiratory failur and acute decomposition of heart failure with moderate to large pericardial effusion. Extubated 3/16 placed on biPAP due to hypercarbia and somnolence, off biPAP 3/18   PMH: bipolar disorder ?schizophrenia with inpatient behavioral health stay, ptsd, hypothyroidism, COPD with ongoing tobacco use disorder. ? ?  ?PT Comments  ? ? Pt lying in bed with eyes open on entry. Very unhappy to have to get up to work with therapy. Mod I for coming to seated EoB, however while PT arranging lines pt lies back down. Pt requires increased encouragement to ambulate.  Pt able to stand with min guard and ambulate in hallway with contact guard to min guard assist. Pt's bed is found to be soaked in urine when she stands but pt seemingly unaware. Pt wanting to get back in bed at end of ambulation and has to be told that she can not get back in a wet bed. Pt voices displeasure however agrees to sit up in recliner. PT recommending HHPT however insurance does not cover, so CM is lining up Outpatient PT with transportation at discharge. Pt will also need supplemental O2 and Rollator at discharge. PT wil continue to follow acutely.  ?  ?Recommendations for follow up therapy are one component of a multi-disciplinary discharge planning process, led by the attending physician.  Recommendations may be updated based on patient status, additional functional criteria and insurance authorization. ? ?Follow Up Recommendations ? Home health PT ?  ?  ?Assistance Recommended at Discharge Frequent or constant  Supervision/Assistance  ?Patient can return home with the following A little help with walking and/or transfers;A lot of help with bathing/dressing/bathroom;Direct supervision/assist for medications management;Direct supervision/assist for financial management;Assist for transportation ?  ?Equipment Recommendations ? Rollator (4 wheels);BSC/3in1  ?  ?   ?Precautions / Restrictions Precautions ?Precautions: Fall ?Restrictions ?Weight Bearing Restrictions: No  ?  ? ?Mobility ? Bed Mobility ?Overal bed mobility: Modified Independent ?  ?  ?  ?  ?  ?  ?General bed mobility comments: requires increased time and effort to come to sitting on a flattened bed ?  ? ?Transfers ?Overall transfer level: Needs assistance ?Equipment used: Rollator (4 wheels) ?Transfers: Sit to/from Stand ?Sit to Stand: Min guard ?  ?  ?  ?  ?  ?General transfer comment: min guard for safety, increased time and effort to self steady, no outside assist required ?  ? ?Ambulation/Gait ?Ambulation/Gait assistance: Min assist, Min guard ?Gait Distance (Feet): 150 Feet ?Assistive device: Rollator (4 wheels) ?Gait Pattern/deviations: Step-through pattern, Decreased step length - right, Decreased step length - left, Shuffle ?Gait velocity: slowed ?Gait velocity interpretation: <1.8 ft/sec, indicate of risk for recurrent falls ?  ?General Gait Details: contact guard assist progressing to min guard for slowed, shuffling gait, mildly unsteady, no overt LoB ? ? ?  ? ? ?  ?Balance Overall balance assessment: Mild deficits observed, not formally tested ?  ?  ?  ?  ?  ?  ?  ?  ?  ?  ?  ?  ?  ?  ?  ?  ?  ?  ?  ? ?  ?  Cognition Arousal/Alertness: Awake/alert ?Behavior During Therapy: Flat affect ?Overall Cognitive Status: No family/caregiver present to determine baseline cognitive functioning ?  ?  ?  ?  ?  ?  ?  ?  ?  ?  ?  ?  ?  ?  ?  ?  ?General Comments: very flat requiring increased verbal cues for Rollator use ?  ?  ? ?  ?   ?General Comments General comments  (skin integrity, edema, etc.): Pt on 4L O2 via [place] SpO2 92%O2, pleth wave variable with ambulation however when good wave form present SpO2 90-91%O2 ?  ?  ? ?Pertinent Vitals/Pain Pain Assessment ?Pain Assessment: No/denies pain  ? ? ? ?PT Goals (current goals can now be found in the care plan section) Acute Rehab PT Goals ?Patient Stated Goal: go home ?PT Goal Formulation: With patient ?Time For Goal Achievement: 05/31/21 ?Potential to Achieve Goals: Good ?Progress towards PT goals: Progressing toward goals ? ?  ?Frequency ? ? ? Min 3X/week ? ? ? ?  ?PT Plan Current plan remains appropriate  ? ? ?   ?AM-PAC PT "6 Clicks" Mobility   ?Outcome Measure ? Help needed turning from your back to your side while in a flat bed without using bedrails?: None ?Help needed moving from lying on your back to sitting on the side of a flat bed without using bedrails?: None ?Help needed moving to and from a bed to a chair (including a wheelchair)?: A Little ?Help needed standing up from a chair using your arms (e.g., wheelchair or bedside chair)?: A Little ?Help needed to walk in hospital room?: A Little ?Help needed climbing 3-5 steps with a railing? : A Lot ?6 Click Score: 19 ? ?  ?End of Session Equipment Utilized During Treatment: Gait belt;Oxygen ?Activity Tolerance: Patient tolerated treatment well ?Patient left: in chair;with call bell/phone within reach;with chair alarm set ?Nurse Communication: Mobility status ?PT Visit Diagnosis: Muscle weakness (generalized) (M62.81);Difficulty in walking, not elsewhere classified (R26.2) ?  ? ? ?Time: 9417-4081 ?PT Time Calculation (min) (ACUTE ONLY): 14 min ? ?Charges:  $Therapeutic Exercise: 8-22 mins          ?          ? ?Jaylee Lantry B. Beverely Risen PT, DPT ?Acute Rehabilitation Services ?Pager [phone] ?Office [phone] ? ? ? ?[name] ?05/19/2021, 12:02 PM ? ?

## 2021-05-19 NOTE — Progress Notes (Signed)
?   SATURATION QUALIFICATIONS: (This note is used to comply with regulatory documentation for home oxygen) ? ?Patient Saturations on Room Air at Rest = 82% ? ?Patient Saturations on Room Air while Ambulating = 82% ? ?Patient Saturations on 4 Liters of oxygen while Ambulating = 90% ? ?Please briefly explain why patient needs home oxygen: ? ?Pt requires supplemental O2 in order to maintain oxygen saturation level above 90% O2. ? ?Gabrielle Carter B. Beverely Risen PT, DPT ?Acute Rehabilitation Services ?Pager [phone] ?Office [phone] ? ?

## 2021-05-20 DIAGNOSIS — J9601 Acute respiratory failure with hypoxia: Secondary | ICD-10-CM | POA: Diagnosis not present

## 2021-05-20 DIAGNOSIS — E035 Myxedema coma: Secondary | ICD-10-CM | POA: Diagnosis not present

## 2021-05-20 DIAGNOSIS — J9602 Acute respiratory failure with hypercapnia: Secondary | ICD-10-CM | POA: Diagnosis not present

## 2021-05-20 LAB — BASIC METABOLIC PANEL
Anion gap: 6 (ref 5–15)
BUN: 23 mg/dL — ABNORMAL HIGH (ref 6–20)
CO2: 34 mmol/L — ABNORMAL HIGH (ref 22–32)
Calcium: 9.1 mg/dL (ref 8.9–10.3)
Chloride: 99 mmol/L (ref 98–111)
Creatinine, Ser: 0.77 mg/dL (ref 0.44–1.00)
GFR, Estimated: >60 mL/min (ref >60)
Glucose, Bld: 129 mg/dL — ABNORMAL HIGH (ref 70–99)
Potassium: 3.8 mmol/L (ref 3.5–5.1)
Sodium: 139 mmol/L (ref 135–145)

## 2021-05-20 LAB — GLUCOSE, CAPILLARY
Glucose-Capillary: 292 mg/dL — ABNORMAL HIGH (ref 70–99)
Glucose-Capillary: 163 mg/dL — ABNORMAL HIGH (ref 70–99)
Glucose-Capillary: 199 mg/dL — ABNORMAL HIGH (ref 70–99)
Glucose-Capillary: 148 mg/dL — ABNORMAL HIGH (ref 70–99)
Glucose-Capillary: 143 mg/dL — ABNORMAL HIGH (ref 70–99)

## 2021-05-20 LAB — MAGNESIUM: Magnesium: 2.1 mg/dL (ref 1.7–2.4)

## 2021-05-20 MED ORDER — HYDROCORTISONE 5 MG PO TABS: 5.0000 mg | ORAL_TABLET | Freq: Two times a day (BID) | ORAL | Status: DC

## 2021-05-20 MED ORDER — HYDROCORTISONE 10 MG PO TABS: 10.0000 mg | ORAL_TABLET | Freq: Two times a day (BID) | ORAL | Status: DC

## 2021-05-20 MED ORDER — LIVING WELL WITH DIABETES BOOK
Freq: Once | Status: AC
  Filled 2021-05-20: qty 1

## 2021-05-20 MED ORDER — METOLAZONE 2.5 MG PO TABS
2.5000 mg | ORAL_TABLET | Freq: Once | ORAL | Status: AC
  Administered 2021-05-20: 2.5 mg via ORAL
  Filled 2021-05-20: qty 1

## 2021-05-20 NOTE — Progress Notes (Signed)
?      ?                 PROGRESS NOTE ? ?      ?PATIENT DETAILS ?Name: Gabrielle Carter ?Age: 46 y.o. ?Sex: female ?Date of Birth: 06-24-1975 ?Admit Date: 05/12/2021 ?Admitting Physician Charlott Holler, MD ?[id number], No Pcp Per (Inactive) ? ?Brief Summary: ?Patient is a 46 y.o.  female with history of bipolar disorder, hypothyroidism, COPD, OSA-who was brought to [facility] with lethargy-she was found to have acute hypercarbic respiratory failure with CO2 in the 90s-patient was emergently intubated and subsequently transferred to [facility] ICU.  She was also found to have a TSH of 141.  See below for further details. ? ?Significant events: ?3/14>> brought to [facility] ED by EMS with hypercapnia/hypoxemic-lethargic.  Intubated.  TSH 141 at [facility]. ?3/15>> transferred to [facility] ICU. ?3/16>> extubated ?3/17>> placed on BiPAP due to somnolence/hypercarbia ?3/18>> off BiPAP ?3/20>> transfer to [facility] ? ?Significant studies: ?3/14>> TSH 141 ?3/14>> CT head (at [facility]): No acute intracranial process ?3/14>> CT chest (at [facility]): Moderate to large pericardial effusion, multifocal groundglass opacities/consolidation in the lungs bilaterally.   ?3/15>> CXR: No obvious PNA-ET tube in place. ?3/15>> Echo: EF 50-55%.  Mild reduction in RV systolic function.  Moderate pericardial effusion-no tamponade. ?3/15>> TSH 95.9 ?3/18>> CXR: Opacity in the medial right lung-?  Developing PNA. ? ?Significant microbiology data: ?3/15>> tracheal aspirate: Streptococcus PNA ? ?Procedures: ?3/14-3/16>> ETT ? ?Consults: ?PCCM ? ?Subjective: ?Patient refused CPAP/BiPAP again, otherwise no significant events ? ?Objective: ?Vitals: ?Blood pressure 122/73, pulse 75, temperature (!) 97.4 ?F (36.3 ?C), temperature source Axillary, resp. rate 17, height 5\' 8"  (1.727 m), weight 134.5 kg, SpO2 99 %.  ? ?Exam: ? ?Awake Alert, Oriented X 3, No new F.N deficits, Normal affect ?Symmetrical Chest wall movement, Good air  movement bilaterally, scattered Rales ?RRR,No Gallops,Rubs or new Murmurs, No Parasternal Heave ?+ve B.Sounds, Abd Soft, No tenderness, No rebound - guarding or rigidity. ?No Cyanosis, Clubbing ,+ edema, No new Rash or bruise   ? ? ?Pertinent Labs/Radiology: ? ?  Latest Ref Rng & Units 05/19/2021  ?  1:46 AM 05/17/2021  ?  4:23 AM 05/16/2021  ?  9:11 AM  ?CBC  ?WBC 4.0 - 10.5 K/uL 10.3   12.2     ?Hemoglobin 12.0 - 15.0 g/dL 05/18/2021   16.0   73.7    ?Hematocrit 36.0 - 46.0 % 43.9   43.0   45.0    ?Platelets 150 - 400 K/uL 210   200     ?  ?Lab Results  ?Component Value Date  ? NA 139 05/20/2021  ? K 3.8 05/20/2021  ? CL 99 05/20/2021  ? CO2 34 (H) 05/20/2021  ?  ? ? ?Assessment/Plan: ? ?Acute hypercarbic/hypoxemic respiratory failure:  ?- Multifactorial due to myxedema coma/OSA-with some elements of PNA/HFpEF exacerbation contributing as well.   ?- Extubated on 3/16-noncompliant-refusing BiPAP ?-Still requiring oxygen, on 4 L nasal cannula this morning, likely will need home oxygen on discharge. ? ? ?Myxedema coma:  ?-Due to noncompliance-continue oral levothyroxine 150 mcg daily (was on IV levothyroxine 75 mcg until 3/19).  Continue to taper down steroids.  Since dosage of levothyroxine just adjusted recently-suspect okay to repeat TSH in a few days. ?-   Note ACTH stim test was done on 3/17-this is unreliable as patient was on IV hydrocortisone.  ?-Continue with rapid hydrocortisone taper.  Will finish last dose today ?-Recheck TSH  level in couple days. ? ?Newly diagnosed DM-2 (A1c 7.2 on 3/21) with steroid-induced hyperglycemia:  ?- CBGs remain elevated-continue SSI-add 10 units of Semglee.  Suspect the steroids can taper down further-CBGs will improve.  ?-To recheck her A1c and 31-month as an outpatient. ? ?Acute metabolic encephalopathy:  ?Due to hypercarbia/myxedema coma-resolved. ? ?Pneumococcal pneumonia: Continue IV Rocephin-x5 days total.  Overall improved-hypoxia improving-Down to 3 L of oxygen today. ? ?HFpEF  exacerbation:  ?- Still with significant lower extremity edema-continue Lasix/acetazolamide (started due to significantly elevated serum bicarb)  ?-Blood pressure has improved after starting midodrine, will give 1 dose of metolazone today to augment her diuresis as she is only -750 cc since admission. ?-Continue to monitor ins and outs closely. ? ?Moderate pericardial effusion: Likely due to myxedema-no tamponade physiology evident on echo.  Plan is to repeat echocardiogram in 6 to 8 weeks. ? ?Hypokalemia: Likely due to diuretics-being repleted.  Recheck tomorrow morning. ? ?Transaminitis: Mild-downtrending-unclear etiology-follow for now.  Acute hepatitis serology was negative. ? ?COPD: Not in exacerbation-continue bronchodilators. ? ?OSA: Unfortunately noncompliant to CPAP at home-and even in the hospital.  Have counseled extensively today-if she consents-use BiPAP nightly and when she sleeps.  Interestingly-mother claims that she does not have a CPAP device at home.  Will likely require home O2-will reassess at the time of discharge. ? ?Bipolar disorder: Has been noncompliant with her medications-since she wanted to restart psych medications-psychiatry was consulted-now on trazodone and risperidone.  Psychiatry following. ? ?Nutrition Status: ?Nutrition Problem: Inadequate oral intake ?Etiology: inability to eat ?Signs/Symptoms: NPO status ?Interventions: Ensure Enlive (each supplement provides 350kcal and 20 grams of protein), MVI ? ?Morbid obesity: ?Estimated body mass index is 45.09 kg/m? as calculated from the following: ?  Height as of this encounter: 5\' 8"  (1.727 m). ?  Weight as of this encounter: 134.5 kg.  ? ?Code status: ?  Code Status: Full Code  ? ?DVT Prophylaxis: ?heparin injection 5,000 Units Start: 05/12/21 1400 ?SCDs Start: 05/12/21 0129 ?  ?Family Communication: none at bedside. ? ? ?Disposition Plan: ?Status is: Inpatient ?Remains inpatient appropriate because: Improving hypoxemia-on IV  antibiotics/diuretics-see above-not yet stable for discharge.  Suspect requires several more days of hospitalization.   ?  ?Planned Discharge Destination:Home health  ? ? ?Diet: ?Diet Order   ? ?       ?  Diet Heart Room service appropriate? Yes; Fluid consistency: Thin; Fluid restriction: 1500 mL Fluid  Diet effective now       ?  ? ?  ?  ? ?  ?  ? ? ?Antimicrobial agents: ?Anti-infectives (From admission, onward)  ? ? Start     Dose/Rate Route Frequency Ordered Stop  ? 05/16/21 1015  cefTRIAXone (ROCEPHIN) 1 g in sodium chloride 0.9 % 100 mL IVPB       ? 1 g ?200 mL/hr over 30 Minutes Intravenous Every 24 hours 05/16/21 0922 05/20/21 0949  ? 05/12/21 1800  cefTRIAXone (ROCEPHIN) 1 g in sodium chloride 0.9 % 100 mL IVPB       ? 1 g ?200 mL/hr over 30 Minutes Intravenous Every 24 hours 05/12/21 0230 05/12/21 1802  ? 05/12/21 1800  azithromycin (ZITHROMAX) 250 mg in dextrose 5 % 125 mL IVPB       ? 250 mg ?127.5 mL/hr over 60 Minutes Intravenous Every 24 hours 05/12/21 0230 05/12/21 1923  ? ?  ? ? ? ?MEDICATIONS: ?Scheduled Meds: ? acetaZOLAMIDE  500 mg Oral BID  ? arformoterol  15 mcg Nebulization  BID  ? budesonide (PULMICORT) nebulizer solution  0.25 mg Nebulization BID  ? Chlorhexidine Gluconate Cloth  6 each Topical Daily  ? famotidine  20 mg Oral Daily  ? feeding supplement  237 mL Oral BID BM  ? furosemide  40 mg Intravenous Daily  ? heparin  5,000 Units Subcutaneous Q8H  ? insulin aspart  0-15 Units Subcutaneous TID WC  ? insulin aspart  0-5 Units Subcutaneous QHS  ? insulin glargine-yfgn  10 Units Subcutaneous Daily  ? levothyroxine  150 mcg Oral Q0600  ? lidocaine  1 patch Transdermal Q24H  ? mouth rinse  15 mL Mouth Rinse q12n4p  ? midodrine  5 mg Oral BID WC  ? multivitamin with minerals  1 tablet Oral Daily  ? potassium chloride  40 mEq Oral Daily  ? risperiDONE  1 mg Oral QHS  ? sodium chloride flush  10-40 mL Intracatheter Q12H  ? traZODone  50 mg Oral QHS  ? ?Continuous Infusions: ? sodium chloride 25  mL/hr at 05/19/21 1000  ? ?PRN Meds:.albuterol, docusate, ipratropium-albuterol, polyethylene glycol, sodium chloride flush ? ? ?I have personally reviewed following labs and imaging studies ? ?LABORATORY DATA: ?CB

## 2021-05-20 NOTE — TOC Progression Note (Signed)
Transition of Care (TOC) - Progression Note  ? ? ?Patient Details  ?Name: Gabrielle Carter ?MRN: [id number] ?Date of Birth: [birth year] ? ?Transition of Care (TOC) CM/SW Contact  ?Tom-Johnson, Hershal Coria, RN ?Phone Number: ?05/20/2021, 10:27 AM ? ?Clinical Narrative:    ? ?Rollator and bedside commode ordered from [employer] and Velna Hatchet to deliver at bedside. CM will continue to follow with needs. ? ?Expected Discharge Plan: OP Rehab ?Barriers to Discharge: Continued Medical Work up ? ?Expected Discharge Plan and Services ?Expected Discharge Plan: OP Rehab ?  ?Discharge Planning Services: CM Consult ?Post Acute Care Choice:  (Outpatient) ?Living arrangements for the past 2 months: Apartment ?                ?DME Arranged: 3-N-1, Walker rolling with seat ?DME Agency: [employer] ?Date DME Agency Contacted: 05/18/21 ?Time DME Agency Contacted: 1535 ?Representative spoke with at DME Agency: Selena Batten ?HH Arranged: NA ?HH Agency: NA [facility].) ?  ?  ?  ? ? ?Social Determinants of Health (SDOH) Interventions ?  ? ?Readmission Risk Interventions ?   ? View : No data to display.  ?  ?  ?  ? ? ?

## 2021-05-20 NOTE — Progress Notes (Signed)
Nutrition Follow-up ? ?DOCUMENTATION CODES:  ? ?Not applicable ? ?INTERVENTION:  ? ?Continue Ensure Enlive po BID, each supplement provides 350 kcal and 20 grams of protein ?Continue MVI with minerals daily ? ?NUTRITION DIAGNOSIS:  ? ?Inadequate oral intake related to inability to eat as evidenced by NPO status. - Progressing, pt now on Regular diet ? ?GOAL:  ? ?Patient will meet greater than or equal to 90% of their needs - Progressing ? ?MONITOR:  ? ?PO intake, Supplement acceptance, Labs, Weight trends ? ?REASON FOR ASSESSMENT:  ? ?Ventilator, Consult ?Enteral/tube feeding initiation and management ? ?ASSESSMENT:  ? ?46 year old female who presented from [facility] on 3/15 with respiratory failure. PMH of bipolar disorder, schizophrenia, PTSD, hypothyroidism, COPD, tobacco use, CHF. Pt was intubated on 3/14. Pt admitted with myxedema coma, acute hypoxemic and hypercapnic respiratory failure, severe sepsis, AKI. ? ?03/16 - extubated, diet advanced to GI soft ?03/22 - transferred to floor  ? ?Pt sitting in chair at time of RD visit.  ? ?Pt reports that her appetite is getting better and that she is eating good. Pt reports that she is drinking 2 Ensures/day. Pt  denies any difficulty chewing or swallowing.  ?Per EMR, pt last 8 intakes recorded were 100%.  ? ?Pt with no other questions or concerns at this time. ? ?Medications reviewed and include: Pepcid, Lasix, SSI 0-15 units TID + 0-5 units daily, Semglee, MVI, Potassium Chloride ?Labs reviewed: Hgb A1c 7.2%, 24 hr CBG 133-192 ? ?Diet Order:   ?Diet Order   ? ?       ?  Diet Heart Room service appropriate? Yes; Fluid consistency: Thin; Fluid restriction: 1500 mL Fluid  Diet effective now       ?  ? ?  ?  ? ?  ? ? ?EDUCATION NEEDS:  ? ?No education needs have been identified at this time ? ?Skin:  Skin Assessment: Reviewed RN Assessment (MASD to groin) ? ?Last BM:  3/19 ? ?Height:  ?Ht Readings from Last 1 Encounters:  ?05/12/21 5\' 8"  (1.727 m)  ? ?Weight:   ?Wt Readings from Last 1 Encounters:  ?05/19/21 134.5 kg  ? ?Ideal Body Weight:  63.6 kg ? ?BMI:  Body mass index is 45.09 kg/m?. ? ?Estimated Nutritional Needs:  ?Kcal:  2000-2200 ?Protein:  115-130 grams ?Fluid:  >/= 1.8 L ? ? ?05/21/21 RD, LDN ?Clinical Dietitian ?See AMiON for contact information.  ? ? ?

## 2021-05-20 NOTE — TOC Progression Note (Signed)
Transition of Care (TOC) - Progression Note  ? ? ?Patient Details  ?Name: Gabrielle Carter ?MRN: [id number] ?Date of Birth: [birth year] ? ?Transition of Care (TOC) CM/SW Contact  ?Tom-Johnson, Hershal Coria, RN ?Phone Number: ?05/20/2021, 8:59 AM ? ?Clinical Narrative:    ? ?Signed Outpatient order, Face Sheet and PT note faxed to [facility], Attention Sarah with successful notice received. CM will continue to follow with needs. ? ?Expected Discharge Plan: OP Rehab ?Barriers to Discharge: Continued Medical Work up ? ?Expected Discharge Plan and Services ?Expected Discharge Plan: OP Rehab ?  ?Discharge Planning Services: CM Consult ?Post Acute Care Choice:  (Outpatient) ?Living arrangements for the past 2 months: Apartment ?                ?DME Arranged: 3-N-1, Walker rolling with seat ?DME Agency: [employer] ?Date DME Agency Contacted: 05/18/21 ?Time DME Agency Contacted: 1535 ?Representative spoke with at DME Agency: Selena Batten ?HH Arranged: NA ?HH Agency: NA [facility].) ?  ?  ?  ? ? ?Social Determinants of Health (SDOH) Interventions ?  ? ?Readmission Risk Interventions ?   ? View : No data to display.  ?  ?  ?  ? ? ?

## 2021-05-20 NOTE — Consult Note (Signed)
Brief Psychiatry Consult Note  ?Attempted to see pt around 1:45. Patient was asleep, roused to voice, and asked for [place] to come back later - due to census will need to come back tomorrow. Denied any SI, HI, AH/VH. Encouraged to get out of bed later today.  ? ?- will try to see tomorrow in AM ? ? ?Joycelyn Schmid A Garmon Dehn ? ?

## 2021-05-20 NOTE — Progress Notes (Signed)
Due to patients recent history of hypercarbic respiratory failure RT discussed the idea of patient wearing a CPAP/BiPAP machine while she slept.  Patient refused the idea. Patient appreciative of RT effort but ultimately still refused.  Patient VSS on 4L [place].  RT will continue to monitor.  ?

## 2021-05-20 NOTE — Progress Notes (Signed)
Inpatient Diabetes Program Recommendations ? ?AACE/ADA: New Consensus Statement on Inpatient Glycemic Control (2015) ? ?Target Ranges:  Prepandial:   less than 140 mg/dL ?     Peak postprandial:   less than 180 mg/dL (1-2 hours) ?     Critically ill patients:  140 - 180 mg/dL  ? ?Lab Results  ?Component Value Date  ? GLUCAP 199 (H) 05/20/2021  ? HGBA1C 7.2 (H) 05/18/2021  ? ? ?Review of Glycemic Control ? Latest Reference Range & Units 05/18/21 21:48 05/19/21 07:45 05/19/21 11:47 05/19/21 17:24 05/19/21 21:42 05/20/21 08:33 05/20/21 11:59  ?Glucose-Capillary 70 - 99 mg/dL 161 (H) 096 (H) 045 (H) 292 (H) 171 (H) 163 (H) 199 (H)  ?(H): Data is abnormally high ?Diabetes history: No ?Outpatient Diabetes medications: NA ?Current orders for Inpatient glycemic control: Semglee 10 units daily, Novolog 0-15 units TID with meals, Novolog 0-5 units QHS  ?Inpatient Diabetes Program Recommendations:   ? ?Steroids tapered.  Blood sugar improved today.  Briefly discussed elevated A1C with patient and told her that it could be due to steroids.  Recommend recheck of A1C when off steroids for 2-3 months.  Reminded her of the importance of monitoring blood sugars at least daily and we discussed normal blood sugar values.  Also encouraged her to eliminate sugar from her beverages and increase water intake.  Please order glucose meter at d/c for at least daily monitoring and close f/u with PCP.  Will also order LWWD booklet for patient.  ? ?Thanks,  ?Beryl Meager, RN, BC-ADM ?Inpatient Diabetes Coordinator ?Pager [phone]  (8a-5p) ? ?

## 2021-05-21 DIAGNOSIS — E035 Myxedema coma: Secondary | ICD-10-CM | POA: Diagnosis not present

## 2021-05-21 DIAGNOSIS — F209 Schizophrenia, unspecified: Secondary | ICD-10-CM

## 2021-05-21 DIAGNOSIS — J9601 Acute respiratory failure with hypoxia: Secondary | ICD-10-CM | POA: Diagnosis not present

## 2021-05-21 DIAGNOSIS — J9602 Acute respiratory failure with hypercapnia: Secondary | ICD-10-CM | POA: Diagnosis not present

## 2021-05-21 LAB — CBC
HCT: 45.4 % (ref 36.0–46.0)
Hemoglobin: 12.9 g/dL (ref 12.0–15.0)
MCH: 25.5 pg — ABNORMAL LOW (ref 26.0–34.0)
MCHC: 28.4 g/dL — ABNORMAL LOW (ref 30.0–36.0)
MCV: 89.9 fL (ref 80.0–100.0)
Platelets: 209 10*3/uL (ref 150–400)
RBC: 5.05 MIL/uL (ref 3.87–5.11)
RDW: 18.1 % — ABNORMAL HIGH (ref 11.5–15.5)
WBC: 10.8 10*3/uL — ABNORMAL HIGH (ref 4.0–10.5)
nRBC: 0 % (ref 0.0–0.2)

## 2021-05-21 LAB — BASIC METABOLIC PANEL
Anion gap: 9 (ref 5–15)
BUN: 21 mg/dL — ABNORMAL HIGH (ref 6–20)
CO2: 37 mmol/L — ABNORMAL HIGH (ref 22–32)
Calcium: 9.3 mg/dL (ref 8.9–10.3)
Chloride: 96 mmol/L — ABNORMAL LOW (ref 98–111)
Creatinine, Ser: 0.74 mg/dL (ref 0.44–1.00)
GFR, Estimated: >60 mL/min (ref >60)
Glucose, Bld: 133 mg/dL — ABNORMAL HIGH (ref 70–99)
Potassium: 3.5 mmol/L (ref 3.5–5.1)
Sodium: 142 mmol/L (ref 135–145)

## 2021-05-21 LAB — GLUCOSE, CAPILLARY
Glucose-Capillary: 149 mg/dL — ABNORMAL HIGH (ref 70–99)
Glucose-Capillary: 122 mg/dL — ABNORMAL HIGH (ref 70–99)
Glucose-Capillary: 181 mg/dL — ABNORMAL HIGH (ref 70–99)
Glucose-Capillary: 150 mg/dL — ABNORMAL HIGH (ref 70–99)

## 2021-05-21 LAB — BRAIN NATRIURETIC PEPTIDE: B Natriuretic Peptide: 88 pg/mL (ref 0.0–100.0)

## 2021-05-21 LAB — TSH: TSH: 45.222 u[IU]/mL — ABNORMAL HIGH (ref 0.350–4.500)

## 2021-05-21 MED ORDER — FUROSEMIDE 10 MG/ML IJ SOLN
40.0000 mg | Freq: Two times a day (BID) | INTRAMUSCULAR | Status: DC
  Administered 2021-05-21: 40 mg via INTRAVENOUS
  Filled 2021-05-21: qty 4

## 2021-05-21 MED ORDER — POTASSIUM CHLORIDE CRYS ER 20 MEQ PO TBCR
20.0000 meq | EXTENDED_RELEASE_TABLET | Freq: Once | ORAL | Status: AC
  Administered 2021-05-21: 20 meq via ORAL
  Filled 2021-05-21: qty 1

## 2021-05-21 MED ORDER — MIDODRINE HCL 5 MG PO TABS
5.0000 mg | ORAL_TABLET | Freq: Two times a day (BID) | ORAL | Status: DC
Start: 2021-05-21 — End: 2021-05-22
  Administered 2021-05-21: 5 mg via ORAL
  Filled 2021-05-21: qty 1

## 2021-05-21 MED ORDER — FLUOXETINE HCL 20 MG PO CAPS
20.0000 mg | ORAL_CAPSULE | Freq: Every day | ORAL | Status: DC
  Administered 2021-05-22: 20 mg via ORAL
  Filled 2021-05-21 (×2): qty 1

## 2021-05-21 MED ORDER — MIDODRINE HCL 5 MG PO TABS
10.0000 mg | ORAL_TABLET | Freq: Two times a day (BID) | ORAL | Status: DC
Start: 2021-05-21 — End: 2021-05-21
  Administered 2021-05-21: 10 mg via ORAL
  Filled 2021-05-21: qty 2
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
## 2021-05-21 NOTE — TOC Progression Note (Signed)
Transition of Care (TOC) - Progression Note  ? ? ?Patient Details  ?Name: Gabrielle Carter ?MRN: [id number] ?Date of Birth: 03/13/1975 ? ?Transition of Care (TOC) CM/SW Contact  ?Lockie Pares, RN ?Phone Number: ?05/21/2021, 11:05 AM ? ?Clinical Narrative:    ? ?Oxygen qualifications done, ordered oxygen for home use at 3LPM via [facility]. Previous DME already ordered., ordered from [employer].  ? ?Expected Discharge Plan: OP Rehab ?Barriers to Discharge: No Barriers Identified ? ?Expected Discharge Plan and Services ?Expected Discharge Plan: OP Rehab ?  ?Discharge Planning Services: CM Consult ?Post Acute Care Choice:  (Outpatient) ?Living arrangements for the past 2 months: Apartment ?                ?DME Arranged: Oxygen ?DME Agency: [employer] ?Date DME Agency Contacted: 05/21/21 ?Time DME Agency Contacted: 1105 ?Representative spoke with at DME Agency: Velna Hatchet ?HH Arranged: NA ?HH Agency: NA [facility].) ?  ?  ?  ? ? ?Social Determinants of Health (SDOH) Interventions ?  ? ?Readmission Risk Interventions ?   ? View : No data to display.  ?  ?  ?  ? ? ?

## 2021-05-21 NOTE — Progress Notes (Signed)
?      ?                 PROGRESS NOTE ? ?      ?PATIENT DETAILS ?Name: Gabrielle Carter ?Age: 46 y.o. ?Sex: female ?Date of Birth: 1975-08-10 ?Admit Date: 05/12/2021 ?Admitting Physician Charlott HollerNikita S Desai, MD ?[id number]:Patient, No Pcp Per (Inactive) ? ?Brief Summary: ?Patient is a 46 y.o.  female with history of bipolar disorder, hypothyroidism, COPD, OSA-who was [facility] with lethargy-she was found to have acute hypercarbic respiratory failure with CO2 in the 90s-patient was emergently intubated and subsequently [facility].  She was also found to have a TSH of 141.  See below for further details. ? ?Significant events: ?3/14>> [facility] ED by EMS with hypercapnia/hypoxemic-lethargic.  Intubated.  [facility]. ?3/15>> [facility]. ?3/16>> extubated ?3/17>> placed on BiPAP due to somnolence/hypercarbia ?3/18>> off BiPAP ?3/20>> [facility] ? ?Significant studies: ?3/14>> TSH 141 ?3/14>> [facility] head [facility] No acute intracranial process ?3/14>> [facility] Moderate to large pericardial effusion, multifocal groundglass opacities/consolidation in the lungs bilaterally.   ?3/15>> CXR: No obvious PNA-ET tube in place. ?3/15>> Echo: EF 50-55%.  Mild reduction in RV systolic function.  Moderate pericardial effusion-no tamponade. ?3/15>> TSH 95.9 ?3/18>> CXR: Opacity in the medial right lung-?  Developing PNA. ? ?Significant microbiology data: ?3/15>> tracheal aspirate: Streptococcus PNA ? ?Procedures: ?3/14-3/16>> ETT ? ?Consults: ?PCCM ? ?Subjective: ?No significant events overnight, she denies any complaints, she ambulated with PT yesterday. ?Objective: ?Vitals: ?Blood pressure 118/71, pulse 73, temperature 98.6 ?F (37 ?C), temperature source Oral, resp. rate 15, height 5\' 8"  (1.727 m), weight 134.5 kg, SpO2 94 %.  ? ?Exam: ? ?Awake Alert, Oriented X 3, No new F.N deficits, Normal affect ?Symmetrical Chest wall movement,  Good air movement bilaterally, CTAB ?RRR,No Gallops,Rubs or new Murmurs, No Parasternal Heave ?+ve B.Sounds, Abd Soft, No tenderness, No rebound - guarding or rigidity. ?No Cyanosis, Clubbing ,+ edema, No new Rash or bruise   ? ? ? ?Pertinent Labs/Radiology: ? ?  Latest Ref Rng & Units 05/21/2021  ?  6:06 AM 05/19/2021  ?  1:46 AM 05/17/2021  ?  4:23 AM  ?CBC  ?WBC 4.0 - 10.5 K/uL 10.8   10.3   12.2    ?Hemoglobin 12.0 - 15.0 g/dL 78.412.9   69.612.7   29.512.9    ?Hematocrit 36.0 - 46.0 % 45.4   43.9   43.0    ?Platelets 150 - 400 K/uL 209   210   200    ?  ?Lab Results  ?Component Value Date  ? NA 142 05/21/2021  ? K 3.5 05/21/2021  ? CL 96 (L) 05/21/2021  ? CO2 37 (H) 05/21/2021  ?  ? ? ?Assessment/Plan: ? ?Acute hypercarbic/hypoxemic respiratory failure:  ?- Multifactorial due to myxedema coma/OSA-with some elements of PNA/HFpEF exacerbation contributing as well.   ?- Extubated on 3/16-noncompliant-refusing BiPAP ?-Still requiring oxygen, on 4 L nasal cannula this morning, she will need home oxygen on discharge. ? ? ?Myxedema coma:  ?-Due to noncompliance-continue oral levothyroxine 150 mcg daily (was on IV levothyroxine 75 mcg until 3/19).  Continue to taper down steroids.  Since dosage of levothyroxine just adjusted recently-suspect okay to repeat TSH in a few days. ?-   Note ACTH stim test was done on 3/17-this is unreliable as patient was on IV hydrocortisone.  ?-Continue with rapid hydrocortisone taper.  Hydrocortisone was tapered off yesterday ?-TSH is improving 95> 45 ? ?Newly diagnosed DM-2 (A1c 7.2 on 3/21) with steroid-induced hyperglycemia:  ?- CBGs remain elevated-continue SSI-add 10 units of Semglee.  Suspect the steroids can taper down further-CBGs will improve.  ?-To recheck her A1c and 31-month as an outpatient. ? ?Acute metabolic encephalopathy:  ?Due to hypercarbia/myxedema coma-resolved. ? ?Pneumococcal pneumonia: Continue IV Rocephin-x5 days total.  Overall improved-hypoxia improving-Down to 3 L of oxygen  today. ? ?HFpEF exacerbation:  ?- Still with significant lower extremity edema-continue Lasix/acetazolamide (started due to significantly elevated serum bicarb)  ?-Started on midodrine to allow more blood pressure room for diuresis, difficult to diurese, she received 1 dose of metolazone today with 4.5 negative output over last 24 hours, but blood pressure is soft this morning, so I will hold on further metolazone for today.   ? ?Moderate pericardial effusion: Likely due to myxedema-no tamponade physiology evident on echo.  Plan is to repeat echocardiogram in 6 to 8 weeks. ? ?Hypokalemia: Likely due to diuretics-being repleted.  Recheck tomorrow morning. ? ?Transaminitis: Mild-downtrending-unclear etiology-follow for now.  Acute hepatitis serology was negative. ? ?COPD: Not in exacerbation-continue bronchodilators. ? ?OSA: Unfortunately noncompliant to CPAP at home-and even in the hospital.  Have counseled extensively today-if she consents-use BiPAP nightly and when she sleeps.  Interestingly-mother claims that she does not have a CPAP device at home.  Will likely require home O2-will reassess at the time of discharge. ? ?Bipolar disorder: Has been noncompliant with her medications-since she wanted to restart psych medications-psychiatry was consulted-now on trazodone and risperidone.  Psychiatry following. ? ?Nutrition Status: ?Nutrition Problem: Inadequate oral intake ?Etiology: inability to eat ?Signs/Symptoms: NPO status ?Interventions: Ensure Enlive (each supplement provides 350kcal and 20 grams of protein), MVI ? ?Morbid obesity: ?Estimated body mass index is 45.09 kg/m? as calculated from the following: ?  Height as of this encounter: 5\' 8"  (1.727 m). ?  Weight as of this encounter: 134.5 kg.  ? ?Code status: ?  Code Status: Full Code  ? ?DVT Prophylaxis: ?heparin injection 5,000 Units Start: 05/12/21 1400 ?SCDs Start: 05/12/21 0129 ?  ?Family Communication: none at bedside. ? ? ?Disposition Plan: ?Status is:  Inpatient ?Remains inpatient appropriate because: Improving hypoxemia-on IV antibiotics/diuretics-see above-not yet stable for discharge.  Suspect requires several more days of hospitalization.   ?  ?Planned Discharge Destination:Home health  ? ? ?Diet: ?Diet Order   ? ?       ?  Diet Heart Room service appropriate? Yes; Fluid consistency: Thin; Fluid restriction: 1500 mL Fluid  Diet effective now       ?  ? ?  ?  ? ?  ?  ? ? ?Antimicrobial agents: ?Anti-infectives (From admission, onward)  ? ? Start     Dose/Rate Route Frequency Ordered Stop  ? 05/16/21 1015  cefTRIAXone (ROCEPHIN) 1 g in sodium chloride 0.9 % 100 mL IVPB       ? 1 g ?200 mL/hr over 30 Minutes Intravenous Every 24 hours 05/16/21 0922 05/20/21 0949  ? 05/12/21 1800  cefTRIAXone (ROCEPHIN) 1 g in sodium chloride 0.9 % 100 mL IVPB       ? 1 g ?200 mL/hr over 30 Minutes Intravenous Every 24 hours 05/12/21 0230 05/12/21 1802  ? 05/12/21 1800  azithromycin (ZITHROMAX) 250 mg in dextrose 5 % 125 mL IVPB       ? 250 mg ?127.5 mL/hr over 60 Minutes Intravenous Every 24 hours 05/12/21 0230 05/12/21 1923  ? ?  ? ? ? ?  MEDICATIONS: ?Scheduled Meds: ? acetaZOLAMIDE  500 mg Oral BID  ? arformoterol  15 mcg Nebulization BID  ? budesonide (PULMICORT) nebulizer solution  0.25 mg Nebulization BID  ? Chlorhexidine Gluconate Cloth  6 each Topical Daily  ? famotidine  20 mg Oral Daily  ? feeding supplement  237 mL Oral BID BM  ? furosemide  40 mg Intravenous Daily  ? heparin  5,000 Units Subcutaneous Q8H  ? insulin aspart  0-15 Units Subcutaneous TID WC  ? insulin aspart  0-5 Units Subcutaneous QHS  ? insulin glargine-yfgn  10 Units Subcutaneous Daily  ? levothyroxine  150 mcg Oral Q0600  ? lidocaine  1 patch Transdermal Q24H  ? mouth rinse  15 mL Mouth Rinse q12n4p  ? midodrine  10 mg Oral BID WC  ? multivitamin with minerals  1 tablet Oral Daily  ? potassium chloride  40 mEq Oral Daily  ? risperiDONE  1 mg Oral QHS  ? sodium chloride flush  10-40 mL Intracatheter Q12H   ? traZODone  50 mg Oral QHS  ? ?Continuous Infusions: ? sodium chloride 25 mL/hr at 05/19/21 1000  ? ?PRN Meds:.albuterol, docusate, ipratropium-albuterol, polyethylene glycol, sodium chloride flush

## 2021-05-21 NOTE — Progress Notes (Signed)
OT Cancellation Note ? ?Patient Details ?Name: Gabrielle Carter ?MRN: [id number] ?DOB: Feb 07, 1976 ? ? ?Cancelled Treatment:    Reason Eval/Treat Not Completed: Fatigue/lethargy limiting ability to participate Pt sleeping deeply at this time. Will follow up for OT session as schedule permits.  ? ?Lorre Munroe ?05/21/2021, 7:59 AM ?

## 2021-05-21 NOTE — Progress Notes (Signed)
Physical Therapy Treatment ?Patient Details ?Name: Gabrielle Carter ?MRN: [id number] ?DOB: Jul 17, 1975 ?Today's Date: 05/21/2021 ? ? ?History of Present Illness 46 y.o. woman brought to [facility] via EMS when her mother found her lethargic at home. SpO2 40-60s in field, placed on NRB. Intubated and central line placed 05/11/21. Transferred to [facility] ICU 05/12/21 for treatment of myxedema Coma, acute hypoxemic and hypercapnic respiratory failur and acute decomposition of heart failure with moderate to large pericardial effusion. Extubated 3/16 placed on biPAP due to hypercarbia and somnolence, off biPAP 3/18   PMH: bipolar disorder ?schizophrenia with inpatient behavioral health stay, ptsd, hypothyroidism, COPD with ongoing tobacco use disorder. ? ?  ?PT Comments  ? ? Pt admitted with above diagnosis. Pt continues to incr distance with ambulation. Pt with no significant LOB with min challenges.  Has more difficulty navigating in uncontrolled environment. Pt with decr safety awareness at times.  Pt currently with functional limitations due to balance and endurance deficits. Pt will benefit from skilled PT to increase their independence and safety with mobility to allow discharge to the venue listed below.      ?Recommendations for follow up therapy are one component of a multi-disciplinary discharge planning process, led by the attending physician.  Recommendations may be updated based on patient status, additional functional criteria and insurance authorization. ? ?Follow Up Recommendations ? Home health PT (however does not qualify for HHPT so CM setting up Outpatient PT with transportation) ?  ?  ?Assistance Recommended at Discharge Frequent or constant Supervision/Assistance  ?Patient can return home with the following A little help with walking and/or transfers;A lot of help with bathing/dressing/bathroom;Direct supervision/assist for medications management;Direct supervision/assist for financial  management;Assist for transportation ?  ?Equipment Recommendations ? Rollator (4 wheels);BSC/3in1  ?  ?Recommendations for Other Services   ? ? ?  ?Precautions / Restrictions Precautions ?Precautions: Fall ?Precaution Comments: monitor O2 (does not wear at baseline) ?Restrictions ?Weight Bearing Restrictions: No  ?  ? ?Mobility ? Bed Mobility ?  ?  ?  ?  ?  ?  ?  ?General bed mobility comments: on toilet on arrival ?  ? ?Transfers ?Overall transfer level: Needs assistance ?Equipment used: Rolling walker (2 wheels) ?Transfers: Sit to/from Stand ?Sit to Stand: Min assist ?  ?  ?  ?  ?  ?General transfer comment: min assist to rise from lower toilet and cues to use grab bar,  increased time and effort to self steady ?  ? ?Ambulation/Gait ?Ambulation/Gait assistance: Min assist, Min guard ?Gait Distance (Feet): 300 Feet ?Assistive device: Rolling walker (2 wheels) ?Gait Pattern/deviations: Step-through pattern, Decreased step length - right, Decreased step length - left, Shuffle ?Gait velocity: slowed ?Gait velocity interpretation: <1.8 ft/sec, indicate of risk for recurrent falls ?  ?General Gait Details: min guard for slowed, shuffling gait, mildly unsteady at times with challenging environment, no overt LoB ? ? ?Stairs ?  ?  ?  ?  ?  ? ? ?Wheelchair Mobility ?  ? ?Modified Rankin (Stroke Patients Only) ?  ? ? ?  ?Balance   ?  ?  ?  ?  ?  ?  ?  ?  ?  ?  ?  ?  ?  ?  ?  ?  ?  ?  ?  ? ?  ?Cognition Arousal/Alertness: Awake/alert ?Behavior During Therapy: Flat affect ?Overall Cognitive Status: No family/caregiver present to determine baseline cognitive functioning ?  ?  ?  ?  ?  ?  ?  ?  ?  ?  ?  ?  ?  ?  ?  ?  ?  General Comments: very flat ?  ?  ? ?  ?Exercises General Exercises - Lower Extremity ?Ankle Circles/Pumps: AROM, Both, 10 reps, Seated ?Long Arc Quad: AROM, Both, 10 reps, Seated ?Hip Flexion/Marching: AROM, Both, 10 reps, Seated ? ?  ?General Comments General comments (skin integrity, edema, etc.): Pt on 4LO2  with VSS ?  ?  ? ?Pertinent Vitals/Pain Pain Assessment ?Pain Assessment: No/denies pain ?Breathing: normal  ? ? ?Home Living   ?  ?  ?  ?  ?  ?  ?  ?  ?  ?   ?  ?Prior Function    ?  ?  ?   ? ?PT Goals (current goals can now be found in the care plan section) Acute Rehab PT Goals ?Patient Stated Goal: go home ?Progress towards PT goals: Progressing toward goals ? ?  ?Frequency ? ? ? Min 3X/week ? ? ? ?  ?PT Plan Current plan remains appropriate  ? ? ?Co-evaluation   ?  ?  ?  ?  ? ?  ?AM-PAC PT "6 Clicks" Mobility   ?Outcome Measure ? Help needed turning from your back to your side while in a flat bed without using bedrails?: None ?Help needed moving from lying on your back to sitting on the side of a flat bed without using bedrails?: None ?Help needed moving to and from a bed to a chair (including a wheelchair)?: A Little ?Help needed standing up from a chair using your arms (e.g., wheelchair or bedside chair)?: A Little ?Help needed to walk in hospital room?: A Little ?Help needed climbing 3-5 steps with a railing? : A Lot ?6 Click Score: 19 ? ?  ?End of Session Equipment Utilized During Treatment: Gait belt;Oxygen ?Activity Tolerance: Patient tolerated treatment well ?Patient left: in chair;with call bell/phone within reach;with chair alarm set ?Nurse Communication: Mobility status ?PT Visit Diagnosis: Muscle weakness (generalized) (M62.81);Difficulty in walking, not elsewhere classified (R26.2) ?  ? ? ?Time: 8032-1224 ?PT Time Calculation (min) (ACUTE ONLY): 25 min ? ?Charges:  $Gait Training: 8-22 mins ?$Therapeutic Exercise: 8-22 mins          ?          ? ?Renay Crammer M,PT ?Acute Rehab Services ?[phone] ?[phone] (pager)  ? ? ?Bevelyn Buckles ?05/21/2021, 4:03 PM ? ?

## 2021-05-22 DIAGNOSIS — I5033 Acute on chronic diastolic (congestive) heart failure: Secondary | ICD-10-CM | POA: Diagnosis not present

## 2021-05-22 DIAGNOSIS — E035 Myxedema coma: Secondary | ICD-10-CM | POA: Diagnosis not present

## 2021-05-22 DIAGNOSIS — E119 Type 2 diabetes mellitus without complications: Secondary | ICD-10-CM

## 2021-05-22 DIAGNOSIS — J13 Pneumonia due to Streptococcus pneumoniae: Secondary | ICD-10-CM | POA: Diagnosis not present

## 2021-05-22 DIAGNOSIS — J9601 Acute respiratory failure with hypoxia: Secondary | ICD-10-CM | POA: Diagnosis not present

## 2021-05-22 DIAGNOSIS — E669 Obesity, unspecified: Secondary | ICD-10-CM

## 2021-05-22 DIAGNOSIS — I5030 Unspecified diastolic (congestive) heart failure: Secondary | ICD-10-CM

## 2021-05-22 DIAGNOSIS — F200 Paranoid schizophrenia: Secondary | ICD-10-CM

## 2021-05-22 LAB — CBC
HCT: 47.4 % — ABNORMAL HIGH (ref 36.0–46.0)
Hemoglobin: 13.7 g/dL (ref 12.0–15.0)
MCH: 25.3 pg — ABNORMAL LOW (ref 26.0–34.0)
MCHC: 28.9 g/dL — ABNORMAL LOW (ref 30.0–36.0)
MCV: 87.6 fL (ref 80.0–100.0)
Platelets: 225 10*3/uL (ref 150–400)
RBC: 5.41 MIL/uL — ABNORMAL HIGH (ref 3.87–5.11)
RDW: 18.1 % — ABNORMAL HIGH (ref 11.5–15.5)
WBC: 11.1 10*3/uL — ABNORMAL HIGH (ref 4.0–10.5)
nRBC: 0 % (ref 0.0–0.2)

## 2021-05-22 LAB — GLUCOSE, CAPILLARY
Glucose-Capillary: 120 mg/dL — ABNORMAL HIGH (ref 70–99)
Glucose-Capillary: 171 mg/dL — ABNORMAL HIGH (ref 70–99)

## 2021-05-22 LAB — BASIC METABOLIC PANEL
Anion gap: 8 (ref 5–15)
BUN: 21 mg/dL — ABNORMAL HIGH (ref 6–20)
CO2: 36 mmol/L — ABNORMAL HIGH (ref 22–32)
Calcium: 9.7 mg/dL (ref 8.9–10.3)
Chloride: 93 mmol/L — ABNORMAL LOW (ref 98–111)
Creatinine, Ser: 0.73 mg/dL (ref 0.44–1.00)
GFR, Estimated: >60 mL/min (ref >60)
Glucose, Bld: 131 mg/dL — ABNORMAL HIGH (ref 70–99)
Potassium: 3.8 mmol/L (ref 3.5–5.1)
Sodium: 137 mmol/L (ref 135–145)

## 2021-05-22 LAB — BRAIN NATRIURETIC PEPTIDE: B Natriuretic Peptide: 42 pg/mL (ref 0.0–100.0)

## 2021-05-22 MED ORDER — PANTOPRAZOLE SODIUM 40 MG PO TBEC
40.0000 mg | DELAYED_RELEASE_TABLET | Freq: Every day | ORAL | 0 refills | Status: AC
Start: 2021-05-22 — End: ?

## 2021-05-22 MED ORDER — LEVOTHYROXINE SODIUM 100 MCG PO TABS: 100.0000 ug | ORAL_TABLET | Freq: Every day | ORAL | 2 refills | Status: AC

## 2021-05-22 MED ORDER — RISPERIDONE 1 MG PO TABS
1.0000 mg | ORAL_TABLET | Freq: Every day | ORAL | 0 refills | Status: AC
Start: 2021-05-22 — End: ?

## 2021-05-22 MED ORDER — TRAZODONE HCL 50 MG PO TABS: 50.0000 mg | ORAL_TABLET | Freq: Every evening | ORAL | 0 refills | Status: AC | PRN

## 2021-05-22 MED ORDER — METFORMIN HCL 500 MG PO TABS
500.0000 mg | ORAL_TABLET | Freq: Two times a day (BID) | ORAL | 0 refills | Status: AC
Start: 2021-05-22 — End: 2021-06-21

## 2021-05-22 MED ORDER — FUROSEMIDE 40 MG PO TABS
40.0000 mg | ORAL_TABLET | Freq: Every day | ORAL | Status: DC
  Administered 2021-05-22: 40 mg via ORAL
  Filled 2021-05-22: qty 1

## 2021-05-22 MED ORDER — ENSURE ENLIVE PO LIQD: 237.0000 mL | Freq: Two times a day (BID) | ORAL | 12 refills | Status: AC

## 2021-05-22 MED ORDER — FLUOXETINE HCL 40 MG PO CAPS: 40.0000 mg | ORAL_CAPSULE | Freq: Every day | ORAL | 0 refills | Status: AC

## 2021-05-22 MED ORDER — FUROSEMIDE 40 MG PO TABS: 20.0000 mg | ORAL_TABLET | Freq: Every day | ORAL | 0 refills | Status: AC

## 2021-05-22 NOTE — TOC Transition Note (Signed)
Transition of Care (TOC) - CM/SW Discharge Note ? ? ?Patient Details  ?Name: Gabrielle Carter ?MRN: [id number] ?Date of Birth: April 11, 1975 ? ?Transition of Care (TOC) CM/SW Contact:  ?Lawerance Sabal, RN ?Phone Number: ?05/22/2021, 11:17 AM ? ? ?Clinical Narrative:    ?OP therapy set up through [facility] by [facility] 3/22.  ?Oxygen and 3/1 in room to take home. ?[employer] states that rollator has been shipped to the house on 3/23.  ?PCP [facility] added to AVS, CM spoke to patient's mom Juanita on 3/21 ? ? ? ? ?Final next level of care: Home/Self Care ?Barriers to Discharge: No Barriers Identified ? ? ?Patient Goals and CMS Choice ?Patient states their goals for this hospitalization and ongoing recovery are:: To return home ?CMS Medicare.gov Compare Post Acute Care list provided to:: Patient ?Choice offered to / list presented to : Patient ? ?Discharge Placement ?  ?           ?  ?  ?  ?  ? ?Discharge Plan and Services ?  ?Discharge Planning Services: CM Consult ?Post Acute Care Choice:  (Outpatient)          ?DME Arranged: Oxygen ?DME Agency: [employer] ?Date DME Agency Contacted: 05/21/21 ?Time DME Agency Contacted: [phone] ?Representative spoke with at DME Agency: Velna Hatchet ?HH Arranged: NA ?HH Agency: NA [facility].) ?  ?  ?  ? ?Social Determinants of Health (SDOH) Interventions ?  ? ? ?Readmission Risk Interventions ?   ? View : No data to display.  ?  ?  ?  ? ? ? ? ? ?

## 2021-05-22 NOTE — Discharge Summary (Signed)
Physician Discharge Summary  ?Gabrielle Carter ZOX:[id number] DOB: 04-22-1975 DOA: 05/12/2021 ? ?PCP: Patient, No Pcp Per (Inactive) ? ?Admit date: 05/12/2021 ?Discharge date: 05/22/2021 ? ?Admitted From: Home ?Disposition:  Home ? ?Recommendations for Outpatient Follow-up:  ?Follow up with PCP in 1-2 weeks ?Please obtain BMP/CBC in one week ?Patient will need sleep study as an outpatient ?Please continue counseling patient about medication compliance ?Please check TSH and free T4 in 4 weeks and adjust medication as needed. ?Please check A1c in 3 months. ?Please repeat echo in 8 weeks. ? ?Home Health: YES ?Equipment/Devices: Oxygen, 3 in 1, rolling walker. ? ?Discharge Condition:Stable ?CODE STATUS:FULL ?Diet recommendation: Heart Healthy / Carb Modified  ? ?Brief/Interim Summary: ? ?Patient is a 46 y.o.  female with history of bipolar disorder, hypothyroidism, COPD, OSA-who was [facility]-she was found to have acute hypercarbic respiratory failure with CO2 in the 90s-patient was emergently intubated and subsequently [facility].  She was also found to have a TSH of 141.  See below for further details. ?  ?Significant events: ?3/14>> [facility] with hypercapnia/hypoxemic-lethargic.  Intubated.  [facility]. ?3/15>> [facility]. ?3/16>> extubated ?3/17>> placed on BiPAP due to somnolence/hypercarbia ?3/18>> off BiPAP ?3/20>> [facility] ?  ?Significant studies: ?3/14>> TSH 141 ?3/14>> [facility] No acute intracranial process ?3/14>> [facility] Moderate to large pericardial effusion, multifocal groundglass opacities/consolidation in the lungs bilaterally.   ?3/15>> CXR: No obvious PNA-ET tube in place. ?3/15>> Echo: EF 50-55%.  Mild reduction in RV systolic function.  Moderate pericardial effusion-no tamponade. ?3/15>> TSH 95.9 ?3/18>> CXR: Opacity in the medial right lung-?   Developing PNA. ?  ?Significant microbiology data: ?3/15>> tracheal aspirate: Streptococcus PNA ?  ?Procedures: ?3/14-3/16>> ETT ?  ?Consults: ?PCCM ? ?Acute hypercarbic/hypoxemic respiratory failure:  ?- Multifactorial due to myxedema coma/OSA-with some elements of PNA/HFpEF exacerbation contributing as well.   ?- Extubated on 3/16-noncompliant-refusing BiPAP through  hospital stay ?- Still requiring oxygen, on 4 L nasal cannula, this has been arranged on discharge ?-Recommend sleeping study as an outpatient. ? ?  ?Myxedema coma:  ?-Due to noncompliance-continue oral levothyroxine 150 mcg daily (was on IV levothyroxine 75 mcg until 3/19).  Continue to taper down steroids.  Since dosage of levothyroxine just adjusted recently-suspect okay to repeat TSH in a few days. ?-   Note ACTH stim test was done on 3/17-this is unreliable as patient was on IV hydrocortisone.  ?-Continue with rapid hydrocortisone taper.  Hydrocortisone was tapered off couple days ago ?-TSH is improving 95> 45 ?-Patient will be discharged on her home dose, will need to recheck TSH and free T4 and 4 weeks and adjust dose as needed, I have counseled the patient about importance of compliance with medications, I have discussed with her mother as well who will ensure patient compliance. ?  ?Newly diagnosed DM-2 (A1c 7.2 on 3/21) with steroid-induced hyperglycemia:  ?- CBGs remain elevated-continue SSI-add 10 units of Semglee.  Suspect the steroids can taper down further-CBGs will improve.  ?-To recheck her A1c and 259-month as an outpatient. ?-She will be started on metformin on discharge. ?  ?Acute metabolic encephalopathy:  ?Due to hypercarbia/myxedema coma-resolved. ?  ?Pneumococcal pneumonia: Continue IV Rocephin-x5 days total.  Overall improved-hypoxia improving-Down to 3 L of oxygen today. ?  ?HFpEF exacerbation:  ?- Still with significant lower extremity edema-continue Lasix/acetazolamide (started due to significantly elevated serum  bicarb)   ?-Started on midodrine to allow more blood pressure room for diuresis, she diuresed very well with metolazone, she appears euvolemic currently with normal BNP at time of discharge . ?-She will be discharged on low-dose Lasix 20 mg oral daily.   ?  ?Moderate pericardial effusion: ? Likely due to myxedema-no tamponade physiology evident on echo.  Plan is to repeat echocardiogram in 6 to 8 weeks. ?  ?Hypokalemia: Repleted ?  ?Transaminitis: Mild-downtrending-unclear etiology-follow for now.  Acute hepatitis serology was negative. ?  ?COPD: Not in exacerbation-continue bronchodilators. ?  ?OSA: Unfortunately noncompliant to CPAP at home-and even in the hospital.  Have counseled extensively during hospital stay, and she is noncompliant with either CPAP or BiPAP . ?-We will DC on home oxygen . ?-Likely will need repeat sleep study and CPAP at home if she agrees to using it.  . ? ?Bipolar disorder/schizophrenia : ?has been noncompliant with her medications-since she wanted to restart psych medications-psychiatry was consulted-now on trazodone, Prozac and risperidone.  They have been prescribed to her at time of discharge. ?  ?Morbid obesity: ?Estimated body mass index is 45.09 kg/m? as calculated from the following: ?  Height as of this encounter: 5\' 8"  (1.727 m). ?  Weight as of this encounter: 134.5 kg.  ?  ? ?Discharge Diagnoses:  ?Principal Problem: ?  Acute respiratory failure with hypoxia and hypercapnia (HCC) ?Active Problems: ?  Wheezing ?  Schizophrenia (HCC) ?  Myxedema coma (HCC) ?  Diabetes mellitus (HCC) ?  CAP (community acquired pneumonia) due to Pneumococcus [facility]) ?  (HFpEF) heart failure with preserved ejection fraction (HCC) ?  Obesity ? ? ? ?Discharge Instructions ? ?Discharge Instructions   ? ? Ambulatory referral to Physical Therapy   Complete by: As directed ?  ? Ambulatory referral to Physical Therapy   Complete by: As directed ?  ? Increase activity slowly   Complete by: As directed ?  ? No wound  care   Complete by: As directed ?  ? ?  ? ?Allergies as of 05/22/2021   ? ?   Reactions  ? Bee Venom Anaphylaxis  ? ?  ? ?  ?Medication List  ?  ? ?STOP taking these medications   ? ?carbamazepine 400 MG 12 hr tablet ?Commonly known as: TEGRETOL XR ?  ?meloxicam 7.5 MG tablet ?Commonly known as: MOBIC ?  ?OLANZapine 10 MG tablet ?Commonly known as: ZYPREXA ?  ?topiramate 50 MG tablet ?Commonly known as: TOPAMAX ?  ? ?  ? ?TAKE these medications   ? ?albuterol 108 (90 Base) MCG/ACT inhaler ?Commonly known as: VENTOLIN HFA ?Inhale 2 puffs into the lungs every 6 (six) hours as needed for wheezing or shortness of breath. ?  ?clotrimazole 1 % cream ?Commonly known as: LOTRIMIN ?Apply topically 2 (two) times daily. ?  ?feeding supplement Liqd ?Take 237 mLs by mouth 2 (two) times daily between meals. ?  ?FLUoxetine 40 MG capsule ?Commonly known as: PROZAC ?Take 1 capsule (40 mg total) by mouth daily. ?  ?furosemide 40 MG tablet ?Commonly known as: LASIX ?Take 0.5 tablets (20 mg total) by mouth daily. ?Start taking on: May 23, 2021 ?  ?levothyroxine 100 MCG tablet ?Commonly known as: SYNTHROID ?Take 1 tablet (100 mcg total) by mouth daily at 6 (six) AM. ?  ?metFORMIN 500 MG tablet ?Commonly known as: Glucophage ?Take 1 tablet (500 mg total) by mouth 2 (two) times daily with a meal. ?  ?pantoprazole 40 MG tablet ?Commonly known as:  PROTONIX ?Take 1 tablet (40 mg total) by mouth daily. ?  ?risperiDONE 1 MG tablet ?Commonly known as: RISPERDAL ?Take 1 tablet (1 mg total) by mouth at bedtime. ?  ?traZODone 50 MG tablet ?Commonly known as: DESYREL ?Take 1 tablet (50 mg total) by mouth at bedtime as needed for sleep. ?  ? ?  ? ?  ?  ? ? ?  ?Durable Medical Equipment  ?(From admission, onward)  ?  ? ? ?  ? ?  Start     Ordered  ? 05/21/21 1638  For home use only DME oxygen  Once       ?Question Answer Comment  ?Length of Need Lifetime   ?Mode or (Route) Nasal cannula   ?Liters per Minute 4   ?Frequency Continuous (stationary and  portable oxygen unit needed)   ?Oxygen conserving device Yes   ?Oxygen delivery system Gas   ?  ? 05/21/21 1637  ? 05/18/21 1604  For home use only DME 4 wheeled rolling walker with seat  Once       ?Question:  Dennie Bible

## 2021-05-22 NOTE — Discharge Instructions (Signed)
Follow with Primary MD  in 7 days  ? ?Get CBC, CMP,  checked  by Primary MD next visit.  ? ? ?Activity: As tolerated with Full fall precautions use walker/cane & assistance as needed ? ? ?Disposition Home  ? ? ?Diet: Heart Healthy /carb modified ? ?For Heart failure patients - Check your Weight same time everyday, if you gain over 2 pounds, or you develop in leg swelling, experience more shortness of breath or chest pain, call your Primary MD immediately. Follow Cardiac Low Salt Diet and 1.5 lit/day fluid restriction. ? ? ?On your next visit with your primary care physician please Get Medicines reviewed and adjusted. ? ? ?Please request your Prim.MD to go over all Hospital Tests and Procedure/Radiological results at the follow up, please get all Hospital records sent to your Prim MD by signing hospital release before you go home. ? ? ?If you experience worsening of your admission symptoms, develop shortness of breath, life threatening emergency, suicidal or homicidal thoughts you must seek medical attention immediately by calling 911 or calling your MD immediately  if symptoms less severe. ? ?You Must read complete instructions/literature along with all the possible adverse reactions/side effects for all the Medicines you take and that have been prescribed to you. Take any new Medicines after you have completely understood and accpet all the possible adverse reactions/side effects.  ? ?Do not drive, operating heavy machinery, perform activities at heights, swimming or participation in water activities or provide baby sitting services if your were admitted for syncope or siezures until you have seen by Primary MD or a Neurologist and advised to do so again. ? ?Do not drive when taking Pain medications.  ? ? ?Do not take more than prescribed Pain, Sleep and Anxiety Medications ? ?Special Instructions: If you have smoked or chewed Tobacco  in the last 2 yrs please stop smoking, stop any regular Alcohol  and or any  Recreational drug use. ? ?Wear Seat belts while driving. ? ? ?Please note ? ?You were cared for by a hospitalist during your hospital stay. If you have any questions about your discharge medications or the care you received while you were in the hospital after you are discharged, you can call the unit and asked to speak with the hospitalist on call if the hospitalist that took care of you is not available. Once you are discharged, your primary care physician will handle any further medical issues. Please note that NO REFILLS for any discharge medications will be authorized once you are discharged, as it is imperative that you return to your primary care physician (or establish a relationship with a primary care physician if you do not have one) for your aftercare needs so that they can reassess your need for medications and monitor your lab values.  ?

## 2021-05-22 NOTE — Progress Notes (Signed)
Discharge instructions given to the patient.  Patient verbalized understanding.  DMEs delivered to the room - [facility] and Oxygen equipment.  Walker delivered to home.  This RN called patient's mother to inform her of patient's discharge today.  Patient's mother said that she can come pick her up at 2 pm, she will call the nursing station to let us know.  Patient is aware. ?

## 2021-05-22 NOTE — Progress Notes (Deleted)
? ?New Patient Office Visit ? ?Subjective:  ?Patient ID: Gabrielle Carter, female    DOB: September 25, 1975  Age: 46 y.o. MRN: [id number] ? ?CC: No chief complaint on file. ? ? ?HPI ?Janiqua Nevin BloodgoodLynn Carter presents for HFU and new pt to est care, cannot do TOC visit d/t no RN call 48hrs after DC ? ?Eye foot urine alb pap colon  ?A1C 05/18/21: 7.2 ? ?Admit date: 05/12/2021 ?Discharge date: 05/22/2021 ?  ?Admitted From: Home ?Disposition:  Home ?  ?Recommendations for Outpatient Follow-up:  ?Follow up with PCP in 1-2 weeks ?Please obtain BMP/CBC in one week ?Patient will need sleep study as an outpatient ?Please continue counseling patient about medication compliance ?Please check TSH and free T4 in 4 weeks and adjust medication as needed. ?Please check A1c in 3 months. ?Please repeat echo in 8 weeks. ?  ?Home Health: YES ?Equipment/Devices: Oxygen, 3 in 1, rolling walker. ?  ?Discharge Condition:Stable ?CODE STATUS:FULL ?Diet recommendation: Heart Healthy / Carb Modified  ?  ?Brief/Interim Summary: ?  ?Patient is a 46 y.o.  female with history of bipolar disorder, hypothyroidism, COPD, OSA-who was [facility] with [facility]argy-she was found to have acute hypercarbic respiratory failure with CO2 in the 90s-patient was emergently intubated and subsequently [facility].  She was also found to have a TSH of 141.  See below for further details. ?  ?Significant events: ?3/14>> [facility] with hypercapnia/hypoxemic-lethargic.  Intubated.  [facility]. ?3/15>> [facility]. ?3/16>> extubated ?3/17>> placed on BiPAP due to somnolence/hypercarbia ?3/18>> off BiPAP ?3/20>> [facility] ?  ?Significant studies: ?3/14>> TSH 141 ?3/14>> [facility] No acute intracranial process ?3/14>> [facility] Moderate to large pericardial effusion, multifocal groundglass opacities/consolidation in the lungs bilaterally.   ?3/15>> CXR: No  obvious PNA-ET tube in place. ?3/15>> Echo: EF 50-55%.  Mild reduction in RV systolic function.  Moderate pericardial effusion-no tamponade. ?3/15>> TSH 95.9 ?3/18>> CXR: Opacity in the medial right lung-?  Developing PNA. ?  ?Significant microbiology data: ?3/15>> tracheal aspirate: Streptococcus PNA ?  ?Procedures: ?3/14-3/16>> ETT ?  ?Consults: ?PCCM ?  ?Acute hypercarbic/hypoxemic respiratory failure:  ?- Multifactorial due to myxedema coma/OSA-with some elements of PNA/HFpEF exacerbation contributing as well.   ?- Extubated on 3/16-noncompliant-refusing BiPAP through  hospital stay ?- Still requiring oxygen, on 4 L nasal cannula, this has been arranged on discharge ?-Recommend sleeping study as an outpatient. ?  ?  ?Myxedema coma:  ?-Due to noncompliance-continue oral levothyroxine 150 mcg daily (was on IV levothyroxine 75 mcg until 3/19).  Continue to taper down steroids.  Since dosage of levothyroxine just adjusted recently-suspect okay to repeat TSH in a few days. ?-   Note ACTH stim test was done on 3/17-this is unreliable as patient was on IV hydrocortisone.  ?-Continue with rapid hydrocortisone taper.  Hydrocortisone was tapered off couple days ago ?-TSH is improving 95> 45 ?-Patient will be discharged on her home dose, will need to recheck TSH and free T4 and 4 weeks and adjust dose as needed, I have counseled the patient about importance of compliance with medications, I have discussed with her mother as well who will ensure patient compliance. ?  ?Newly diagnosed DM-2 (A1c 7.2 on 3/21) with steroid-induced hyperglycemia:  ?- CBGs remain elevated-continue SSI-add 10 units of Semglee.  Suspect the steroids can taper down further-CBGs will improve.  ?-To recheck her A1c and 5327-month as  an outpatient. ?-She will be started on metformin on discharge. ?  ?Acute metabolic encephalopathy:  ?Due to hypercarbia/myxedema coma-resolved. ?  ?Pneumococcal pneumonia: Continue IV Rocephin-x5 days total.  Overall  improved-hypoxia improving-Down to 3 L of oxygen today. ?  ?HFpEF exacerbation:  ?- Still with significant lower extremity edema-continue Lasix/acetazolamide (started due to significantly elevated serum bicarb)  ?-Started on midodrine to allow more blood pressure room for diuresis, she diuresed very well with metolazone, she appears euvolemic currently with normal BNP at time of discharge . ?-She will be discharged on low-dose Lasix 20 mg oral daily.   ?  ?Moderate pericardial effusion: ? Likely due to myxedema-no tamponade physiology evident on echo.  Plan is to repeat echocardiogram in 6 to 8 weeks. ?  ?Hypokalemia: Repleted ?  ?Transaminitis: Mild-downtrending-unclear etiology-follow for now.  Acute hepatitis serology was negative. ?  ?COPD: Not in exacerbation-continue bronchodilators. ?  ?OSA: Unfortunately noncompliant to CPAP at home-and even in the hospital.  Have counseled extensively during hospital stay, and she is noncompliant with either CPAP or BiPAP . ?-We will DC on home oxygen . ?-Likely will need repeat sleep study and CPAP at home if she agrees to using it.  . ?  ?Bipolar disorder/schizophrenia : ?has been noncompliant with her medications-since she wanted to restart psych medications-psychiatry was consulted-now on trazodone, Prozac and risperidone.  They have been prescribed to her at time of discharge. ?  ?Morbid obesity: ?Estimated body mass index is 45.09 kg/m? as calculated from the following: ?  Height as of this encounter: 5\' 8"  (1.727 m). ?  Weight as of this encounter: 134.5 kg.  ?  ?  ?Discharge Diagnoses:  ?Principal Problem: ?  Acute respiratory failure with hypoxia and hypercapnia (HCC) ?Active Problems: ?  Wheezing ?  Schizophrenia (HCC) ?  Myxedema coma (HCC) ?  Diabetes mellitus (HCC) ?  CAP (community acquired pneumonia) due to Pneumococcus [facility]) ?  (HFpEF) heart failure with preserved ejection fraction (HCC) ?  Obesity ?  ? ?Past Medical History:  ?Diagnosis Date  ?? Asthma   ??  Thyroid disease   ? ? ?No past surgical history on file. ? ?No family history on file. ? ?Social History  ? ?Socioeconomic History  ?? Marital status: Legally Separated  ?  Spouse name: Noorah Giammona  ?? Number of children: Not on file  ?? Years of education: Not on file  ?? Highest education level: Not on file  ?Occupational History  ?? Not on file  ?Tobacco Use  ?? Smoking status: Every Day  ?  Packs/day: 1.50  ?  Types: Cigarettes  ?? Smokeless tobacco: Never  ?Vaping Use  ?? Vaping Use: Never used  ?Substance and Sexual Activity  ?? Alcohol use: Yes  ?  Alcohol/week: 2.0 standard drinks  ?  Types: 2 Standard drinks or equivalent per week  ?  Comment: monthly or less  ?? Drug use: Yes  ?  Types: Cocaine, Marijuana  ?? Sexual activity: Yes  ?Other Topics Concern  ?? Not on file  ?Social History Narrative  ?? Not on file  ? ?Social Determinants of Health  ? ?Financial Resource Strain: Not on file  ?Food Insecurity: Not on file  ?Transportation Needs: Not on file  ?Physical Activity: Not on file  ?Stress: Not on file  ?Social Connections: Not on file  ?Intimate Partner Violence: Not on file  ? ? ?ROS ?Review of Systems ? ?Objective:  ? ?Today's Vitals: There were no vitals taken for this visit. ? ?  Physical Exam ? ?Assessment & Plan:  ? ?Problem List Items Addressed This Visit   ?None ? ? ?Outpatient Encounter Medications as of 05/24/2021  ?Medication Sig  ?? albuterol (PROVENTIL HFA;VENTOLIN HFA) 108 (90 Base) MCG/ACT inhaler Inhale 2 puffs into the lungs every 6 (six) hours as needed for wheezing or shortness of breath.  ?? clotrimazole (LOTRIMIN) 1 % cream Apply topically 2 (two) times daily. (Patient not taking: Reported on 12/01/2019)  ?? feeding supplement (ENSURE ENLIVE / ENSURE PLUS) LIQD Take 237 mLs by mouth 2 (two) times daily between meals.  ?? FLUoxetine (PROZAC) 40 MG capsule Take 1 capsule (40 mg total) by mouth daily.  ?? [START ON 05/23/2021] furosemide (LASIX) 40 MG tablet Take 0.5 tablets (20 mg total)  by mouth daily.  ?? levothyroxine (SYNTHROID) 100 MCG tablet Take 1 tablet (100 mcg total) by mouth daily at 6 (six) AM.  ?? metFORMIN (GLUCOPHAGE) 500 MG tablet Take 1 tablet (500 mg total) by mouth 2 (two) t

## 2021-05-24 ENCOUNTER — Inpatient Hospital Stay: Payer: Medicaid Other | Admitting: Critical Care Medicine

## 2021-05-26 LAB — GLUCOSE, CAPILLARY
Glucose-Capillary: 102 mg/dL — ABNORMAL HIGH (ref 70–99)
Glucose-Capillary: 188 mg/dL — ABNORMAL HIGH (ref 70–99)
Glucose-Capillary: 252 mg/dL — ABNORMAL HIGH (ref 70–99)
Glucose-Capillary: 248 mg/dL — ABNORMAL HIGH (ref 70–99)

## 2022-10-27 IMAGING — DX DG CHEST 1V PORT
1 series · 1 of 1 positions shown · non-contrast
Comparison: May 12, 2021

CLINICAL DATA: Acute respiratory failure.  Hypoxia.  Hypercapnia.

EXAM:
PORTABLE CHEST 1 VIEW

[chest]
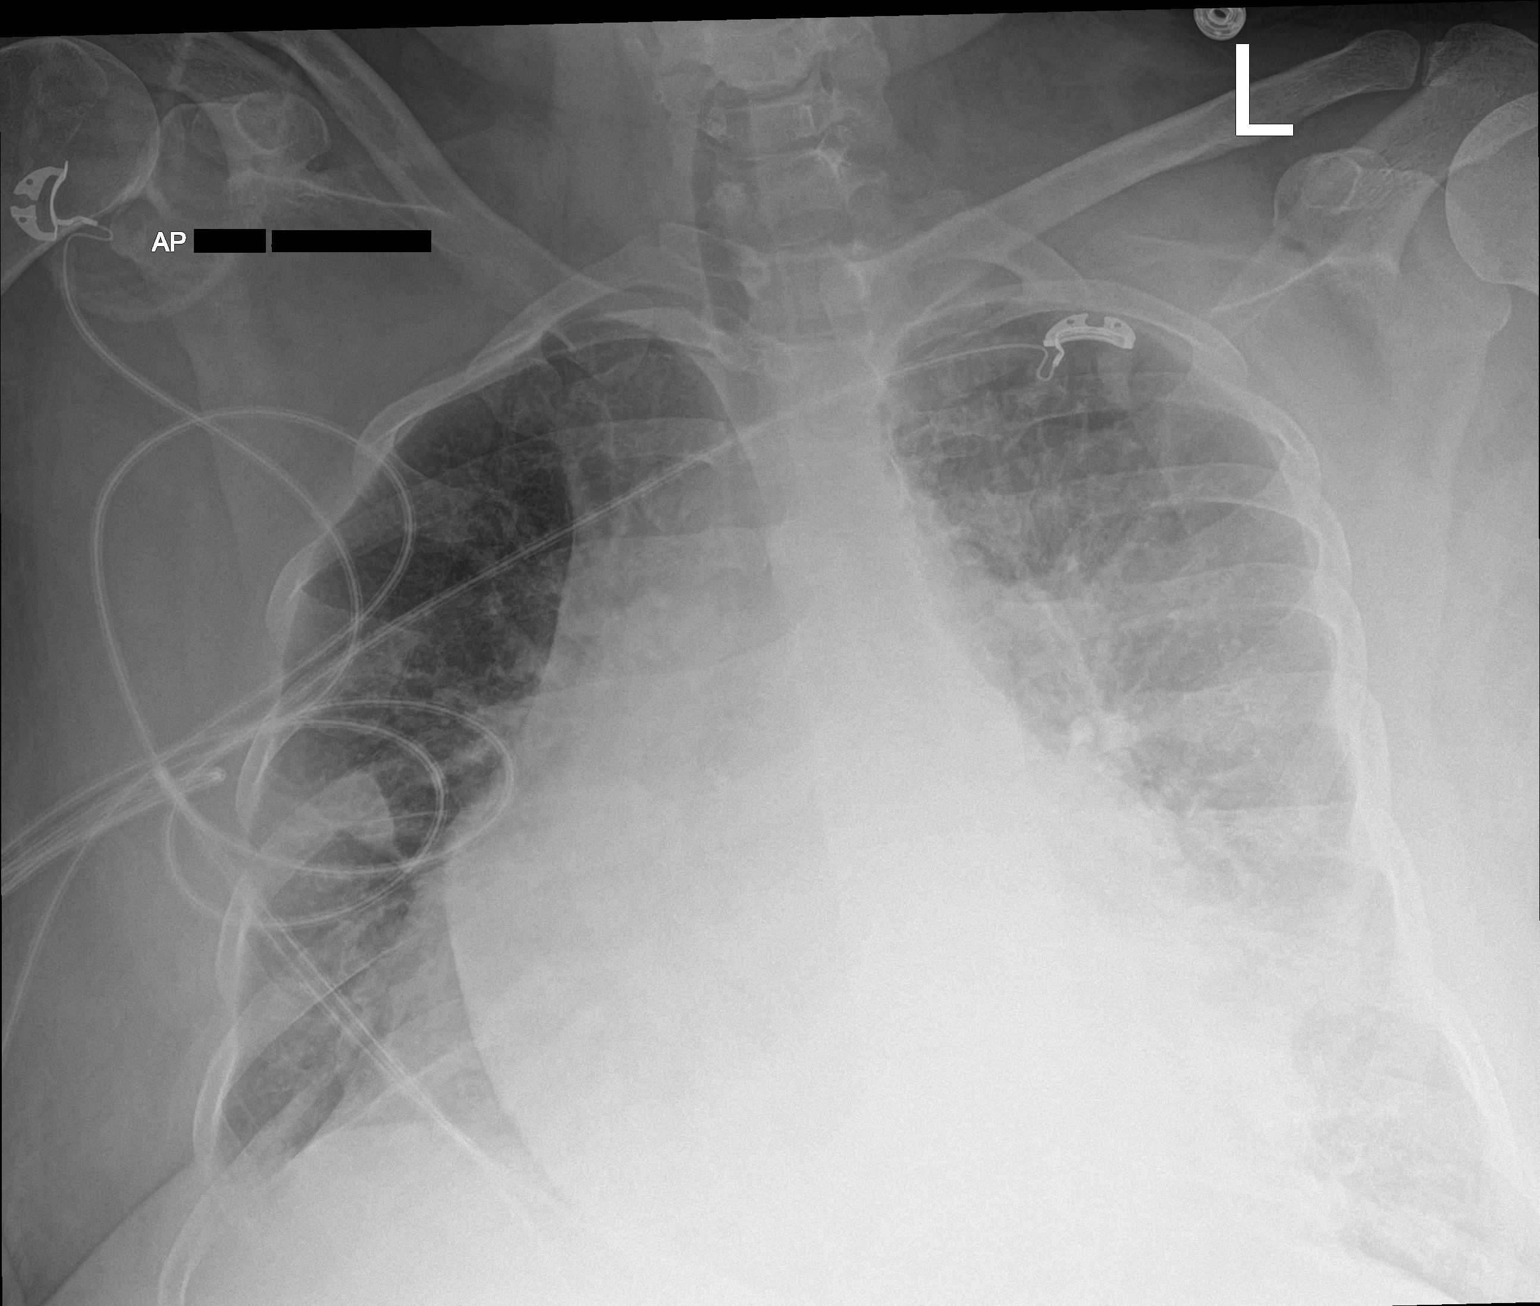

[1 of 1 positions shown; findings below may reference images not displayed]

FINDINGS: The ET tube, NG tube, and right central line have been removed. No
pneumothorax. Stable cardiomegaly. The hila and mediastinum are
unchanged. Increased haziness diffusely over the left hemithorax is
identified. A wedge like opacity is seen in the medial right base.
No other changes.
IMPRESSION: 1. Removal of support apparatus as above.
2. New haziness over the left hemithorax may represent a layering
effusion with underlying atelectasis. Recommend attention on
short-term follow-up.
3. Wedge like opacity in the medial right lung base is likely
atelectasis. Developing infiltrate considered unlikely. Recommend
attention on follow-up.
